# Patient Record
Sex: Female | Born: 1975 | Race: White | Hispanic: No | Marital: Married | Smoking: Never smoker
Health system: Southern US, Community
[De-identification: ages and names within clinical notes are randomized; demographics above are authoritative.]

## PROBLEM LIST (undated history)

## (undated) DIAGNOSIS — U071 COVID-19: Secondary | ICD-10-CM

## (undated) DIAGNOSIS — E282 Polycystic ovarian syndrome: Secondary | ICD-10-CM

## (undated) DIAGNOSIS — I10 Essential (primary) hypertension: Secondary | ICD-10-CM

## (undated) DIAGNOSIS — K219 Gastro-esophageal reflux disease without esophagitis: Secondary | ICD-10-CM

## (undated) DIAGNOSIS — E119 Type 2 diabetes mellitus without complications: Secondary | ICD-10-CM

## (undated) DIAGNOSIS — F419 Anxiety disorder, unspecified: Secondary | ICD-10-CM

## (undated) HISTORY — DX: Essential (primary) hypertension: I10

## (undated) HISTORY — DX: Gastro-esophageal reflux disease without esophagitis: K21.9

## (undated) HISTORY — DX: Type 2 diabetes mellitus without complications: E11.9

## (undated) HISTORY — DX: Polycystic ovarian syndrome: E28.2

## (undated) HISTORY — DX: Anxiety disorder, unspecified: F41.9

## (undated) HISTORY — PX: CHOLECYSTECTOMY: SHX55

## (undated) HISTORY — DX: COVID-19: U07.1

---

## 1998-09-19 ENCOUNTER — Other Ambulatory Visit: Admission: RE | Admit: 1998-09-19 | Discharge: 1998-09-19 | Payer: Self-pay | Admitting: Gynecology

## 2004-05-21 ENCOUNTER — Encounter (INDEPENDENT_AMBULATORY_CARE_PROVIDER_SITE_OTHER): Payer: Self-pay | Admitting: Internal Medicine

## 2004-05-21 LAB — CONVERTED CEMR LAB: Pap Smear: NORMAL

## 2006-03-20 ENCOUNTER — Ambulatory Visit: Payer: Self-pay | Admitting: Internal Medicine

## 2006-03-22 ENCOUNTER — Ambulatory Visit: Payer: Self-pay | Admitting: Internal Medicine

## 2006-06-25 ENCOUNTER — Encounter: Payer: Self-pay | Admitting: Internal Medicine

## 2006-06-25 DIAGNOSIS — R350 Frequency of micturition: Secondary | ICD-10-CM

## 2006-06-25 DIAGNOSIS — M545 Low back pain: Secondary | ICD-10-CM

## 2007-04-30 ENCOUNTER — Ambulatory Visit: Payer: Self-pay | Admitting: Internal Medicine

## 2007-04-30 ENCOUNTER — Encounter (INDEPENDENT_AMBULATORY_CARE_PROVIDER_SITE_OTHER): Payer: Self-pay | Admitting: Internal Medicine

## 2007-04-30 ENCOUNTER — Other Ambulatory Visit: Admission: RE | Admit: 2007-04-30 | Discharge: 2007-04-30 | Payer: Self-pay | Admitting: Internal Medicine

## 2007-04-30 ENCOUNTER — Telehealth (INDEPENDENT_AMBULATORY_CARE_PROVIDER_SITE_OTHER): Payer: Self-pay | Admitting: *Deleted

## 2007-04-30 DIAGNOSIS — N926 Irregular menstruation, unspecified: Secondary | ICD-10-CM | POA: Insufficient documentation

## 2007-04-30 DIAGNOSIS — L680 Hirsutism: Secondary | ICD-10-CM | POA: Insufficient documentation

## 2007-04-30 DIAGNOSIS — R5383 Other fatigue: Secondary | ICD-10-CM

## 2007-04-30 DIAGNOSIS — N939 Abnormal uterine and vaginal bleeding, unspecified: Secondary | ICD-10-CM

## 2007-04-30 DIAGNOSIS — R519 Headache, unspecified: Secondary | ICD-10-CM | POA: Insufficient documentation

## 2007-04-30 DIAGNOSIS — N393 Stress incontinence (female) (male): Secondary | ICD-10-CM | POA: Insufficient documentation

## 2007-04-30 DIAGNOSIS — R51 Headache: Secondary | ICD-10-CM

## 2007-04-30 DIAGNOSIS — R5381 Other malaise: Secondary | ICD-10-CM | POA: Insufficient documentation

## 2007-04-30 DIAGNOSIS — R7309 Other abnormal glucose: Secondary | ICD-10-CM

## 2007-04-30 LAB — CONVERTED CEMR LAB
Beta hcg, urine, semiquantitative: NEGATIVE
Bilirubin Urine: NEGATIVE
Glucose, Urine, Semiquant: NEGATIVE
Hemoglobin: 14.9 g/dL
Specific Gravity, Urine: 1.025
pH: 5.5

## 2007-05-05 ENCOUNTER — Encounter (INDEPENDENT_AMBULATORY_CARE_PROVIDER_SITE_OTHER): Payer: Self-pay | Admitting: Internal Medicine

## 2007-05-06 LAB — CONVERTED CEMR LAB
ALT: 24 units/L (ref 0–35)
AST: 17 units/L (ref 0–37)
Basophils Absolute: 0 10*3/uL (ref 0.0–0.1)
Basophils Relative: 0 % (ref 0–1)
Calcium: 9 mg/dL (ref 8.4–10.5)
Chloride: 103 meq/L (ref 96–112)
Creatinine, Ser: 0.64 mg/dL (ref 0.40–1.20)
DHEA-SO4: 91 ug/dL (ref 35–430)
Hemoglobin: 14.2 g/dL (ref 12.0–15.0)
MCHC: 32.9 g/dL (ref 30.0–36.0)
Monocytes Absolute: 0.6 10*3/uL (ref 0.1–1.0)
Neutro Abs: 4.7 10*3/uL (ref 1.7–7.7)
Platelets: 391 10*3/uL (ref 150–400)
Prolactin: 4.1 ng/mL
RDW: 13.2 % (ref 11.5–15.5)
Testosterone: 62.33 ng/dL (ref 10–70)
Total Bilirubin: 0.4 mg/dL (ref 0.3–1.2)
hCG, Beta Chain, Quant, S: <2 milliintl units/mL

## 2007-05-07 ENCOUNTER — Telehealth (INDEPENDENT_AMBULATORY_CARE_PROVIDER_SITE_OTHER): Payer: Self-pay | Admitting: *Deleted

## 2007-05-12 ENCOUNTER — Ambulatory Visit: Payer: Self-pay | Admitting: Internal Medicine

## 2007-05-12 DIAGNOSIS — E282 Polycystic ovarian syndrome: Secondary | ICD-10-CM

## 2007-06-03 ENCOUNTER — Telehealth (INDEPENDENT_AMBULATORY_CARE_PROVIDER_SITE_OTHER): Payer: Self-pay | Admitting: Internal Medicine

## 2007-12-10 ENCOUNTER — Ambulatory Visit: Payer: Self-pay | Admitting: Internal Medicine

## 2007-12-10 DIAGNOSIS — R252 Cramp and spasm: Secondary | ICD-10-CM

## 2007-12-10 DIAGNOSIS — M7989 Other specified soft tissue disorders: Secondary | ICD-10-CM

## 2007-12-10 DIAGNOSIS — D239 Other benign neoplasm of skin, unspecified: Secondary | ICD-10-CM | POA: Insufficient documentation

## 2007-12-10 LAB — CONVERTED CEMR LAB: TSH: 0.649 microintl units/mL (ref 0.350–4.50)

## 2007-12-11 LAB — CONVERTED CEMR LAB
Calcium: 9.3 mg/dL (ref 8.4–10.5)
Creatinine, Ser: 0.72 mg/dL (ref 0.40–1.20)
Total CK: 86 units/L (ref 7–177)

## 2009-11-11 ENCOUNTER — Ambulatory Visit (HOSPITAL_COMMUNITY): Admission: RE | Admit: 2009-11-11 | Discharge: 2009-11-11 | Payer: Self-pay | Admitting: General Surgery

## 2010-08-06 LAB — CBC
Platelets: 336 10*3/uL (ref 150–400)
RDW: 13.1 % (ref 11.5–15.5)

## 2010-08-06 LAB — SURGICAL PCR SCREEN: MRSA, PCR: NEGATIVE

## 2010-08-06 LAB — BASIC METABOLIC PANEL
BUN: 13 mg/dL (ref 6–23)
Calcium: 9.1 mg/dL (ref 8.4–10.5)
GFR calc non Af Amer: >60 mL/min (ref >60)
Glucose, Bld: 172 mg/dL — ABNORMAL HIGH (ref 70–99)
Sodium: 134 mEq/L — ABNORMAL LOW (ref 135–145)

## 2012-07-02 ENCOUNTER — Ambulatory Visit: Payer: Self-pay | Admitting: Family Medicine

## 2012-08-13 ENCOUNTER — Encounter: Payer: Self-pay | Admitting: Family Medicine

## 2012-08-13 ENCOUNTER — Ambulatory Visit (INDEPENDENT_AMBULATORY_CARE_PROVIDER_SITE_OTHER): Payer: PRIVATE HEALTH INSURANCE | Admitting: Family Medicine

## 2012-08-13 VITALS — BP 160/104 | HR 97 | Resp 16 | Ht 63.0 in | Wt 216.0 lb

## 2012-08-13 DIAGNOSIS — M25561 Pain in right knee: Secondary | ICD-10-CM

## 2012-08-13 DIAGNOSIS — E785 Hyperlipidemia, unspecified: Secondary | ICD-10-CM

## 2012-08-13 DIAGNOSIS — Z1211 Encounter for screening for malignant neoplasm of colon: Secondary | ICD-10-CM

## 2012-08-13 DIAGNOSIS — R7301 Impaired fasting glucose: Secondary | ICD-10-CM

## 2012-08-13 DIAGNOSIS — E669 Obesity, unspecified: Secondary | ICD-10-CM

## 2012-08-13 DIAGNOSIS — M25569 Pain in unspecified knee: Secondary | ICD-10-CM

## 2012-08-13 DIAGNOSIS — I10 Essential (primary) hypertension: Secondary | ICD-10-CM

## 2012-08-13 DIAGNOSIS — E1169 Type 2 diabetes mellitus with other specified complication: Secondary | ICD-10-CM

## 2012-08-13 DIAGNOSIS — E119 Type 2 diabetes mellitus without complications: Secondary | ICD-10-CM

## 2012-08-13 DIAGNOSIS — E8881 Metabolic syndrome: Secondary | ICD-10-CM

## 2012-08-13 DIAGNOSIS — R5381 Other malaise: Secondary | ICD-10-CM

## 2012-08-13 MED ORDER — TRIAMTERENE-HCTZ 37.5-25 MG PO TABS: 1.0000 | ORAL_TABLET | Freq: Every day | ORAL | Status: DC

## 2012-08-13 NOTE — Patient Instructions (Addendum)
F/u in 6 weeks.  You DO have high blood pressure, medication sent start taking once daily every morning  Eat a lot of fresh or frozen vegetable and fruit, cut back on salt, canned foods, and sodas, commit to daily exercise/walking for 20 to 30 minutes , and weight loss please  Tylenol one twice daily for 10 days for thigh pain, call back if no better.  You are referred to Dr Hilda Lias for right knee pain and popping   Fasting labs tomorrow, please ensure you hear the results on Friday. CBC, lipid, cmp, TSH, hBA1C and vit D

## 2012-08-13 NOTE — Progress Notes (Signed)
  Subjective:    Patient ID: Deborah Sutton, female    DOB: 1975-12-11, 37 y.o.   MRN: [id number]  HPI New pt establishment Feb 1 knee popped , right, since  Then has experienced knee pain and instability  2 week h/o bilateral throbbing and fatigue in thighs after standing for 9 hrs,  H/o back pain Fatigue x x 6 weeks. Concerned about weight , had gestational diabetes, has noted increased thirst and excess urination in the past several months. Has been trying with little success to lose weight by cutting back on portions and commitment to exercise in the last approx 6 weeks  Review of Systems See HPI Denies recent fever or chills.c/o fatigue Denies sinus pressure, nasal congestion, ear pain or sore throat. Denies chest congestion, productive cough or wheezing. Denies chest pains, palpitations and leg swelling Denies abdominal pain, nausea, vomiting,diarrhea or constipation.   Denies dysuria, , hesitancy or incontinence. . Denies headaches, seizures, numbness, or tingling. Denies depression, anxiety or insomnia. Denies skin break down or rash.        Objective:   Physical Exam Patient alert and oriented and in no cardiopulmonary distress.Slightly anxious , pleasant female  HEENT: No facial asymmetry, EOMI, no sinus tenderness,  oropharynx pink and moist.  Neck supple no adenopathy.  Chest: Clear to auscultation bilaterally.  CVS: S1, S2 no murmurs, no S3.  ABD: Soft non tender. Bowel sounds normal.  Ext: No edema  MS: Adequate ROM spine, shoulders, hips and reduced in right   knee.  Skin: Intact, no ulcerations or rash noted.  Psych: Good eye contact, normal affect. Memory intact not anxious or depressed appearing.  CNS: CN 2-12 intact, power, tone and sensation normal throughout.        Assessment & Plan:

## 2012-08-14 LAB — COMPREHENSIVE METABOLIC PANEL
ALT: 26 U/L (ref 0–35)
AST: 23 U/L (ref 0–37)
CO2: 25 mEq/L (ref 19–32)
Sodium: 136 mEq/L (ref 135–145)
Total Bilirubin: 0.5 mg/dL (ref 0.3–1.2)
Total Protein: 7.7 g/dL (ref 6.0–8.3)

## 2012-08-14 LAB — TSH: TSH: 0.734 u[IU]/mL (ref 0.350–4.500)

## 2012-08-14 LAB — LIPID PANEL
HDL: 38 mg/dL — ABNORMAL LOW (ref >39)
LDL Cholesterol: 90 mg/dL (ref 0–99)

## 2012-08-14 LAB — CBC WITH DIFFERENTIAL/PLATELET
Basophils Absolute: 0 10*3/uL (ref 0.0–0.1)
Basophils Relative: 0 % (ref 0–1)
Eosinophils Relative: 3 % (ref 0–5)
HCT: 43.5 % (ref 36.0–46.0)
Lymphocytes Relative: 42 % (ref 12–46)
MCHC: 34.5 g/dL (ref 30.0–36.0)
MCV: 85 fL (ref 78.0–100.0)
Monocytes Absolute: 0.5 10*3/uL (ref 0.1–1.0)
RDW: 13.7 % (ref 11.5–15.5)

## 2012-08-15 DIAGNOSIS — E119 Type 2 diabetes mellitus without complications: Secondary | ICD-10-CM | POA: Insufficient documentation

## 2012-08-15 DIAGNOSIS — E1169 Type 2 diabetes mellitus with other specified complication: Secondary | ICD-10-CM | POA: Insufficient documentation

## 2012-08-15 MED ORDER — METFORMIN HCL 500 MG PO TABS: 500.0000 mg | ORAL_TABLET | Freq: Two times a day (BID) | ORAL | Status: DC

## 2012-08-15 MED ORDER — PRAVASTATIN SODIUM 20 MG PO TABS: 20.0000 mg | ORAL_TABLET | Freq: Every day | ORAL | Status: DC

## 2012-08-18 ENCOUNTER — Other Ambulatory Visit: Payer: Self-pay | Admitting: Family Medicine

## 2012-08-18 ENCOUNTER — Telehealth: Payer: Self-pay | Admitting: Family Medicine

## 2012-08-18 MED ORDER — SITAGLIPTIN PHOSPHATE 50 MG PO TABS: 50.0000 mg | ORAL_TABLET | Freq: Every day | ORAL | Status: DC

## 2012-08-18 NOTE — Telephone Encounter (Signed)
C/o diarheah since taking the metformin, having nausea and diarrhea, also will collect [place] script and she is to hold on statin states having neck pain with it She will need to collect coupon for this, januvia, on Friday if too expensive, she will let you know

## 2012-08-18 NOTE — Telephone Encounter (Signed)
Noted and rx and coupon awaiting pick up by patient.

## 2012-08-22 ENCOUNTER — Telehealth (HOSPITAL_COMMUNITY): Payer: Self-pay | Admitting: Dietician

## 2012-08-22 NOTE — Telephone Encounter (Signed)
Received referral via fax from Dr. Simpson for dx: diabetes.  

## 2012-08-22 NOTE — Telephone Encounter (Signed)
Called and left voicemail at [phone].

## 2012-08-25 NOTE — Telephone Encounter (Signed)
fyi

## 2012-08-25 NOTE — Telephone Encounter (Signed)
Pt left voicemail on 08/22/12 at 1503. Called and left message on cell phone at [phone]. Called work number at [employer]. Appointment scheduled for 08/27/12 at 0900.

## 2012-08-27 ENCOUNTER — Encounter (HOSPITAL_COMMUNITY): Payer: Self-pay | Admitting: Dietician

## 2012-08-27 NOTE — Progress Notes (Signed)
Outpatient Initial Nutrition Assessment  Date:08/27/2012   Appt Start Time: [phone]  Referring Physician: Dr. Lodema Hong Reason for Visit: new onset diabetes  Nutrition Assessment:  Height: 5\' 3"  (160 cm)   Weight: 203 lb (92.08 kg)   IBW: 115# %IBW: 171# UBW: 203# %UBW: 100% Body mass index is 35.97 kg/(m^2). Classified as obesity, class II.   Goal Weight: 183# (10% loss of current weight) Weight hx: UBW: 204#. Highest weight was 227# about 5 years ago. Her lowest weight was 187# 11 years ago.   Estimated nutritional needs: 1500-1600 kcals daily, 74-92 grams protein daily, 1.5-1.6 L fluid daily  PMH:  Past Medical History  Diagnosis Date  . Polycystic ovarian syndrome   . Hypertension     Medications:  Current Outpatient Rx  Name  Route  Sig  Dispense  Refill  . pravastatin (PRAVACHOL) 20 MG tablet   Oral   Take 1 tablet (20 mg total) by mouth daily.   30 tablet   4   . sitaGLIPtin (JANUVIA) 50 MG tablet   Oral   Take 1 tablet (50 mg total) by mouth daily.   30 tablet   5     Discontinue metformin   . triamterene-hydrochlorothiazide (MAXZIDE-25) 37.5-25 MG per tablet   Oral   Take 1 each (1 tablet total) by mouth daily.   30 tablet   3     Labs: CMP     Component Value Date/Time   NA 136 08/14/2012 1120   K 4.4 08/14/2012 1120   CL 101 08/14/2012 1120   CO2 25 08/14/2012 1120   GLUCOSE 131* 08/14/2012 1120   BUN 11 08/14/2012 1120   CREATININE 0.89 08/14/2012 1120   CREATININE 0.71 11/08/2009 0825   CALCIUM 9.6 08/14/2012 1120   PROT 7.7 08/14/2012 1120   ALBUMIN 4.4 08/14/2012 1120   AST 23 08/14/2012 1120   ALT 26 08/14/2012 1120   ALKPHOS 56 08/14/2012 1120   BILITOT 0.5 08/14/2012 1120   GFRNONAA >60 11/08/2009 0825   GFRAA  Value: >60        The eGFR has been calculated using the MDRD equation. This calculation has not been validated in all clinical situations. eGFR's persistently <60 mL/min signify possible Chronic Kidney Disease. 11/08/2009 0825    Lipid Panel      Component Value Date/Time   CHOL 192 08/14/2012 1120   TRIG 319* 08/14/2012 1120   HDL 38* 08/14/2012 1120   CHOLHDL 5.1 08/14/2012 1120   VLDL 64* 08/14/2012 1120   LDLCALC 90 08/14/2012 1120     Lab Results  Component Value Date   HGBA1C 7.1* 08/14/2012   Lab Results  Component Value Date   LDLCALC 90 08/14/2012   CREATININE 0.89 08/14/2012     Lifestyle/ social habits: Deborah Sutton is an extremely pleasant lady who lives with her husband, Augusto Gamble, who is present with her today, and their 67 year old daughter, Thea Silversmith. 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 She reports her stress level as an 8-9/10, citing her her job (more specifically corporate expectations and personality conflict with her Agricultural consultant) as her main source of stress. She notes that she has no stress at home. Her husband is very supportive and her daughter is very well-behaved. She would like to make lifestyle changes as a family. She reports she has started walking with her family and 2 dogs about 2 weeks ago, walking about 1-2 miles 3-4 times per week. She reports that she  used exercise videos in the past, but has not followed a consistent exercise routine in several years.   Nutrition hx/habits: [name] reports that she developed gestational diabetes 12 years ago, when she was pregnant with her daughter. She reports that she took a GDM class and she was able to have a very healthy pregnancy, being able to control GDM with diet alone and achieved her lowest adult weight after her daughter was born. She is trying to incorporate things she remembered from this class, but has difficulty recalling all the principles because it has been so long since she took the class. She reports for the past 2 months she has decreased red meat and significant reduced soda and sweets in her diet. She reports she would have a late night snack in front of the television before bedtime which usually consisted of ice cream and cookies, but  she no longer engages in this practice. Her diet has been very high in fat, sugar, and junk food prior to diabetes diagnosis. She reports that she loves sweets.  She is very determined to control her diabetes with diet alone. She reports she was unable to tolerate Metformin and was subsequently prescribed Januvia, which she has not filled yet. She reports Dr. Lodema Hong is giving her until mid May to lose weight and modify her diet and then reevaluate labs. She just started checking fasting CBGS and readings have ranged from 106-167. She is concerned about her weight. She desires to lose weight to improve glycemic control, but confided it is extremely difficult for her top lose weight due to PCOS. She reports that she knows she has last weight over the past weeks because her clothing is looser.   Diet recall: Breakfast: 2/3 cup bran cereal with 2% milk; Lunch: tuna fish sandwich on toast, clementine; Dinner: baked chicken, green beans, red potatoes. Beverages consists mostly of water however, pt does admit to having a 12 oz glass of sweet tea at dinner time.   Nutrition Diagnosis: Nutrition-related knowledge deficit r/t new diagnosed diabetes AEb Hgb A1c: 7.1.  Nutrition Intervention: Nutrition rx: 1300 kcal NAS, diabetic diet; 3 meals per day; 45-60 grams of carbohydrate per meal; low calorie beverages only; 2.5 hours physical activity per week  Education/Counseling Provided: Educated pt on principles of diabetic diet. Discussed carbohydrate metabolism in relation to diabetes. Educated pt on basic self-management principles including: signs and symptoms of hyperglycemia and hypoglycemia, goals for fasting and postprandial blood sugars, goals for Hgb A1c, importance of checking feet, importance of keeping PCP appointments, and foot care. Educated pt on plate method, portion sizes, and sources of carbohydrate. Discussed importance of regular meal pattern. Discussed importance of adding sources of whole grains  to diet to improve glycemic control. Also encouraged to choose low fat dairy, lean meats, and whole fruits and vegetables more often. Discussed options of artificial sweeteners and encouraged pt to use which brand she liked best. Discussed nutritional content of foods commonly eaten and discussed healthier alternatives. Discussed importance of compliance to prevent further complications of disease. Educated pt on importance of physical activity (goal of at least 30 minutes 5 times per week) along with a healthy diet to achieve weight loss and glycemic goals. Encouraged slow, moderate weight loss of 1-2# per week, or 7-10% of current body weight. Provided "Go With The Whole Grain", "My Blood Sugar Diary", "Carbohydrate Counting and Meal Planning", "Guide to Better Office Visits", and "Diabetes and You" handouts. Used TeachBack to assess understanding.   Understanding, Motivation,  Ability to Follow Recommendations: Expect good compliance.   Monitoring and Evaluation: Goals: 1) 1-2# weight loss per week; 2) Hgb A1c < 7.0; 3) 2.5 hours physical activity per week   Recommendations: 1) For weight loss: 1300 kcals daily; 2) Try to eat meals around the same time each day; 3) Incorporate a small snack (15-30 grams carbs) if next meal is longer than 4-5 hours after next meal   F/U: 4-6 weeks. F/U 10/01/12 at 9:30 AM.   Melody Haver, RD, LDN 08/27/2012  Appt EndTime: 1040

## 2012-08-27 NOTE — Progress Notes (Signed)
Noted  

## 2012-08-31 DIAGNOSIS — E669 Obesity, unspecified: Secondary | ICD-10-CM | POA: Insufficient documentation

## 2012-08-31 NOTE — Assessment & Plan Note (Signed)
Ongoing pain following acute injury, reports instability also, ortho referral

## 2012-08-31 NOTE — Assessment & Plan Note (Signed)
Started on metformin, pt intolerant, severe diarrhea, did not want to try [place], wants to opt for lifestyle managemnt only, states she will commit to class, and exercise and will test her blood sugar

## 2012-08-31 NOTE — Assessment & Plan Note (Signed)
Uncontrolled, first formal diagnosis, pt to start medication since value is so high. DASH diet and commitment to daily physical activity for a minimum of 30 minutes discussed and encouraged, as a part of hypertension management. The importance of attaining a healthy weight is also discussed.

## 2012-08-31 NOTE — Assessment & Plan Note (Signed)
.   Patient educated about  the importance of commitment to a  minimum of 150 minutes of exercise per week. The importance of healthy food choices with portion control discussed. Encouraged to start a food diary, count calories and to consider  joining a support group. Sample diet sheets offered. Goals set by the patient for the next several months.    

## 2012-08-31 NOTE — Assessment & Plan Note (Signed)
Based on dx should be on a statin. After adverse reaction to med prescribed uncertain she will comply with med. Very willing to work on lifestyle only, seems very motivaated

## 2012-09-01 ENCOUNTER — Telehealth: Payer: Self-pay | Admitting: Family Medicine

## 2012-09-01 MED ORDER — ONETOUCH DELICA LANCETS 33G MISC: 1.0000 | Freq: Every day | Status: DC

## 2012-09-01 MED ORDER — GLUCOSE BLOOD VI STRP: ORAL_STRIP | Status: DC

## 2012-09-01 NOTE — Telephone Encounter (Signed)
Strips and lancets sent

## 2012-10-01 ENCOUNTER — Encounter: Payer: Self-pay | Admitting: Family Medicine

## 2012-10-01 ENCOUNTER — Encounter (HOSPITAL_COMMUNITY): Payer: Self-pay | Admitting: Dietician

## 2012-10-01 ENCOUNTER — Ambulatory Visit (INDEPENDENT_AMBULATORY_CARE_PROVIDER_SITE_OTHER): Payer: PRIVATE HEALTH INSURANCE | Admitting: Family Medicine

## 2012-10-01 VITALS — BP 120/88 | HR 63 | Resp 16 | Ht 63.0 in | Wt 197.0 lb

## 2012-10-01 DIAGNOSIS — R5383 Other fatigue: Secondary | ICD-10-CM

## 2012-10-01 DIAGNOSIS — E1169 Type 2 diabetes mellitus with other specified complication: Secondary | ICD-10-CM

## 2012-10-01 DIAGNOSIS — I10 Essential (primary) hypertension: Secondary | ICD-10-CM

## 2012-10-01 DIAGNOSIS — L851 Acquired keratosis [keratoderma] palmaris et plantaris: Secondary | ICD-10-CM

## 2012-10-01 DIAGNOSIS — E119 Type 2 diabetes mellitus without complications: Secondary | ICD-10-CM

## 2012-10-01 DIAGNOSIS — E669 Obesity, unspecified: Secondary | ICD-10-CM

## 2012-10-01 DIAGNOSIS — E785 Hyperlipidemia, unspecified: Secondary | ICD-10-CM

## 2012-10-01 DIAGNOSIS — R5381 Other malaise: Secondary | ICD-10-CM

## 2012-10-01 DIAGNOSIS — R234 Changes in skin texture: Secondary | ICD-10-CM | POA: Insufficient documentation

## 2012-10-01 DIAGNOSIS — Z13220 Encounter for screening for lipoid disorders: Secondary | ICD-10-CM

## 2012-10-01 NOTE — Progress Notes (Signed)
  Subjective:    Patient ID: Deborah Sutton, female    DOB: [birth year], 37 y.o.   MRN: [id number]  HPI The PT is here for follow up and re-evaluation of chronic medical conditions, medication management and review of any available recent lab and radiology data.  Preventive health is updated, specifically  Cancer screening and Immunization.  Refuses immunizarion recommended, and will sched pap Questions or concerns regarding consultations or procedures which the PT has had in the interim are  addressed. The PT denies any adverse reactions to current medications since the last visit.  Marked improvement in weight and blood sugar with lifestyle change only , wants to continue this for diabetes and lipid management    Review of Systems See HPI Denies recent fever or chills. Denies sinus pressure, nasal congestion, ear pain or sore throat. Denies chest congestion, productive cough or wheezing. Denies chest pains, palpitations and leg swelling Denies abdominal pain, nausea, vomiting,diarrhea or constipation.   Denies dysuria, frequency, hesitancy or incontinence. Denies joint pain, swelling and limitation in mobility. Denies headaches, seizures, numbness, or tingling. Denies depression, anxiety or insomnia. C/o thickened skin on sole wants help with this        Objective:   Physical Exam   Patient alert and oriented and in no cardiopulmonary distress.  HEENT: No facial asymmetry, EOMI, no sinus tenderness,  oropharynx pink and moist.  Neck supple no adenopathy.  Chest: Clear to auscultation bilaterally.  CVS: S1, S2 no murmurs, no S3.  ABD: Soft non tender. Bowel sounds normal.  Ext: No edema  MS: Adequate ROM spine, shoulders, hips and knees.  Skin: Intact, marked thickening of sole  Psych: Good eye contact, normal affect. Memory intact not anxious or depressed appearing.  CNS: CN 2-12 intact, power, tone and sensation normal throughout.      Assessment & Plan:

## 2012-10-01 NOTE — Patient Instructions (Addendum)
F/u early October , please call if you need me before  Non fasting HBA1C and chem 7 and EGFR  First week in July  CONGRATS on 20 pound weight loss, continue the GREAT habits you have started.   Schedule your eye exam and gyne exam please  Microalb from office today  You are referred to dermatology  Fasting lipid, chem 7 and hBA1C in October before follow up  Weight loss goal of 20 pounds in the next 4.5 months:-)

## 2012-10-01 NOTE — Progress Notes (Signed)
Follow-Up Outpatient Nutrition Note Date: 10/01/2012  Appt Start Time: 0955  Nutrition Assessment:  Current weight: Weight: 193 lb (87.544 kg)  BMI: Body mass index is 34.2 kg/(m^2).  Weight changes: -10# (5.1%) x 1 month  Deborah Sutton has made excellent progress. She has lost 5.1% pounds x 1 month, which is significant. She reports that she has been working hard on her diet and exercise, although complains of being bored of her diet, due to eating the same things constantly. She reports she also feels guilty when she indulges in sweets, even if it is just a small piece of cake at a birthday celebration.  She reports that she is no longer taking medication for diabetes, as she has been given permission to manage blood sugars through lifestyle changes and will reevaluate in July.  She is very determined to be prescribed as little medicine as possible and desires to follow a healthy lifestyle.  Reviewed glucometer. Readings from the past month have ranged between 91-240, usually between 90-120. 7 day average is 122, 14 day average is 128, 30 day averages is 122, and 90 day averages is 122. Pt reports indulging to sweets on vacation a few weeks ago and seeing higher than normal blood sugars, however, blood sugars normalized after returning from vacation. She reports she has 1 instance a few weeks ago where her blood sugar was in the 160's, but couldn't determine why.   Labs: CMP     Component Value Date/Time   NA 136 08/14/2012 1120   K 4.4 08/14/2012 1120   CL 101 08/14/2012 1120   CO2 25 08/14/2012 1120   GLUCOSE 131* 08/14/2012 1120   BUN 11 08/14/2012 1120   CREATININE 0.89 08/14/2012 1120   CREATININE 0.71 11/08/2009 0825   CALCIUM 9.6 08/14/2012 1120   PROT 7.7 08/14/2012 1120   ALBUMIN 4.4 08/14/2012 1120   AST 23 08/14/2012 1120   ALT 26 08/14/2012 1120   ALKPHOS 56 08/14/2012 1120   BILITOT 0.5 08/14/2012 1120   GFRNONAA >60 11/08/2009 0825   GFRAA  Value: >60        The eGFR has been calculated  using the MDRD equation. This calculation has not been validated in all clinical situations. eGFR's persistently <60 mL/min signify possible Chronic Kidney Disease. 11/08/2009 0825    Lipid Panel     Component Value Date/Time   CHOL 192 08/14/2012 1120   TRIG 319* 08/14/2012 1120   HDL 38* 08/14/2012 1120   CHOLHDL 5.1 08/14/2012 1120   VLDL 64* 08/14/2012 1120   LDLCALC 90 08/14/2012 1120     Lab Results  Component Value Date   HGBA1C 7.1* 08/14/2012   Lab Results  Component Value Date   LDLCALC 90 08/14/2012   CREATININE 0.89 08/14/2012     Diet recall: Breakfast: egg OR cereal with low fat milk OR oatmeal OR toast with peanut nutter, 1/2 apple or banana; Lunch: sandwich or leftovers (chicken and vegetables); Dinner: chicken and vegetables. Pt reports she will snack between breakfast and lunch and/or lunch and dinner: snack will consist or yogurt or 100 calorie pack cookies. Pt also reports eating 1 cookie around 7 PM on some nights. Beverages are mostly water. SHe has cut out juice and soda. She will occasionally drink milk.   Nutrition Diagnosis:Nutrition-related knowledge deficit- continues, but progressing  Nutrition Intervention: Nutrition rx: 1300 kcal NAS, diabetic diet; 3 meals per day; 45-60 grams of carbohydrate per meal; low calorie beverages only; 2.5 hours physical activity per  week  Education/ counseling provided: Reviewed principles of diabetic diet and carb counting. Discussed ideas to make healthier choices at restaurants, such as choosing smaller portions or bagging or sharing half the entree, as well as deciding what to order prior to visiting the restaurant. Discussed meal and snack ideas for variety. Discussed ways to incorporpate favorite foods into diet, diet decreasing portion sizes and frequency of rich foods. Encouraged continued exercise. Encouraged continued glucose monitoring and continue to review patterns in readings. Praised pt for efforts made. Teachback method  used.   Understanding/Motivation/ Ability to follow recommendations: Expect good compliance.   Monitoring and Evaluation: Previous Goals: 1) 1-2# weight loss per week- goal met; 2) Hgb A1c < 7.0- unable to assess; 3) 2.5 hours physical activity per week-goal met  Goals for next visit: 1) 1-2# weight loss per week; 2) Hgb A1c < 7.0; 3) 2.5 hours physical activity per week   Recommendations: 1) Add protein source to snacks; 2) Use measuring cups to determine accurate size of portions; 3) Vary exercise routine  F/U:  PRN. Provided RD contact information.   Deborah Sutton, RD, LDN Date:10/01/2012 Appt EndTime: 1610

## 2012-10-02 LAB — MICROALBUMIN / CREATININE URINE RATIO
Creatinine, Urine: 100.8 mg/dL
Microalb Creat Ratio: 5 mg/g (ref 0.0–30.0)
Microalb, Ur: 0.5 mg/dL (ref 0.00–1.89)

## 2012-10-26 NOTE — Assessment & Plan Note (Signed)
Improved. Pt applauded on succesful weight loss through lifestyle change, and encouraged to continue same. Weight loss goal set for the next several months.  

## 2012-10-26 NOTE — Assessment & Plan Note (Addendum)
Dietary management with exercise only with marked improvement and excellent fasting sugars by history. Pt is highly motivate and doing well , no change

## 2012-10-26 NOTE — Assessment & Plan Note (Signed)
Improved though sub optimal , no med chane DASH diet and commitment to daily physical activity for a minimum of 30 minutes discussed and encouraged, as a part of hypertension management. The importance of attaining a healthy weight is also discussed.

## 2012-10-26 NOTE — Assessment & Plan Note (Signed)
Painful, referred to derm for management

## 2012-11-27 ENCOUNTER — Other Ambulatory Visit: Payer: Self-pay

## 2012-12-22 ENCOUNTER — Other Ambulatory Visit: Payer: Self-pay

## 2012-12-22 DIAGNOSIS — I10 Essential (primary) hypertension: Secondary | ICD-10-CM

## 2012-12-22 MED ORDER — TRIAMTERENE-HCTZ 37.5-25 MG PO TABS
1.0000 | ORAL_TABLET | Freq: Every day | ORAL | Status: DC
Start: 2012-12-22 — End: 2013-03-04

## 2013-02-19 ENCOUNTER — Other Ambulatory Visit: Payer: Self-pay | Admitting: Family Medicine

## 2013-02-19 LAB — LIPID PANEL
Cholesterol: 154 mg/dL (ref 0–200)
HDL: 34 mg/dL — ABNORMAL LOW (ref >39)
Total CHOL/HDL Ratio: 4.5 Ratio
Triglycerides: 103 mg/dL (ref <150)
VLDL: 21 mg/dL (ref 0–40)

## 2013-02-19 LAB — BASIC METABOLIC PANEL
Glucose, Bld: 96 mg/dL (ref 70–99)
Potassium: 4.7 mEq/L (ref 3.5–5.3)
Sodium: 137 mEq/L (ref 135–145)

## 2013-03-04 ENCOUNTER — Encounter: Payer: Self-pay | Admitting: Family Medicine

## 2013-03-04 ENCOUNTER — Ambulatory Visit (INDEPENDENT_AMBULATORY_CARE_PROVIDER_SITE_OTHER): Payer: PRIVATE HEALTH INSURANCE | Admitting: Family Medicine

## 2013-03-04 VITALS — BP 140/90 | HR 72 | Resp 18 | Ht 63.0 in | Wt 183.1 lb

## 2013-03-04 DIAGNOSIS — E785 Hyperlipidemia, unspecified: Secondary | ICD-10-CM

## 2013-03-04 DIAGNOSIS — E669 Obesity, unspecified: Secondary | ICD-10-CM

## 2013-03-04 DIAGNOSIS — E1169 Type 2 diabetes mellitus with other specified complication: Secondary | ICD-10-CM

## 2013-03-04 DIAGNOSIS — R5381 Other malaise: Secondary | ICD-10-CM

## 2013-03-04 DIAGNOSIS — I10 Essential (primary) hypertension: Secondary | ICD-10-CM

## 2013-03-04 DIAGNOSIS — Z23 Encounter for immunization: Secondary | ICD-10-CM

## 2013-03-04 DIAGNOSIS — E119 Type 2 diabetes mellitus without complications: Secondary | ICD-10-CM

## 2013-03-04 NOTE — Assessment & Plan Note (Signed)
Uncontrolled, pt has discontinued her medication, she is advised to resume same DASH diet and commitment to daily physical activity for a minimum of 30 minutes discussed and encouraged, as a part of hypertension management. The importance of attaining a healthy weight is also discussed.

## 2013-03-04 NOTE — Progress Notes (Signed)
  Subjective:    Patient ID: Deborah Sutton, female    DOB: 1975/12/02, 37 y.o.   MRN: [id number]  HPI The PT is here for follow up and re-evaluation of chronic medical conditions, medication management and review of any available recent lab and radiology data.  Preventive health is updated, specifically  Cancer screening and Immunization.   Questions or concerns regarding consultations or procedures which the PT has had in the interim are  Addressed.Did see podiatry, needs pap still There are no new concerns.  There are no specific complaints  Exercises 5 days per week , for 90 mins per session, eating 1350 cals per day      Review of Systems See HPI Denies recent fever or chills. Denies sinus pressure, nasal congestion, ear pain or sore throat. Denies chest congestion, productive cough or wheezing. Denies chest pains, palpitations and leg swelling Denies abdominal pain, nausea, vomiting,diarrhea or constipation.   Denies dysuria, frequency, hesitancy or incontinence. Denies joint pain, swelling and limitation in mobility. Denies headaches, seizures, numbness, or tingling. Denies depression, anxiety or insomnia. Denies skin break down or rash.        Objective:   Physical Exam  Patient alert and oriented and in no cardiopulmonary distress.  HEENT: No facial asymmetry, EOMI, no sinus tenderness,  oropharynx pink and moist.  Neck supple no adenopathy.  Chest: Clear to auscultation bilaterally.  CVS: S1, S2 no murmurs, no S3.  ABD: Soft non tender. Bowel sounds normal.  Ext: No edema  MS: Adequate ROM spine, shoulders, hips and knees.  Skin: Intact, no ulcerations or rash noted.  Psych: Good eye contact, normal affect. Memory intact not anxious or depressed appearing.  CNS: CN 2-12 intact, power, tone and sensation normal throughout.       Assessment & Plan:

## 2013-03-04 NOTE — Assessment & Plan Note (Addendum)
Marked improvement with change in diet Pt applauded on this Hyperlipidemia:Low fat diet discussed and encouraged. She is to commit to exercise 7 days per week instead of 5 days per week

## 2013-03-04 NOTE — Patient Instructions (Signed)
F/u in 6 month, early April, call if you need me before  CONGRATS, keep going forward  HBA1C, chem 7, lipid, tSH, CBC fasting for April visit  Weight loss goal of 2.5 to 3 pounds per month  Commit to physical activity or health 7 days per week, 45 minutes

## 2013-03-04 NOTE — Assessment & Plan Note (Signed)
Patient advised to reduce carb and sweets, commit to regular physical activity, take meds as prescribed, test blood as directed, and attempt to lose weight, to improve blood sugar control. improved

## 2013-03-04 NOTE — Assessment & Plan Note (Signed)
Improved. Pt applauded on succesful weight loss through lifestyle change, and encouraged to continue same. Weight loss goal set for the next several months.  

## 2014-04-13 ENCOUNTER — Ambulatory Visit: Payer: PRIVATE HEALTH INSURANCE | Admitting: Family Medicine

## 2014-04-20 ENCOUNTER — Encounter: Payer: Self-pay | Admitting: *Deleted

## 2014-04-20 ENCOUNTER — Ambulatory Visit: Payer: PRIVATE HEALTH INSURANCE | Admitting: Family Medicine

## 2015-01-03 ENCOUNTER — Encounter: Payer: Self-pay | Admitting: Family Medicine

## 2015-01-03 ENCOUNTER — Ambulatory Visit (INDEPENDENT_AMBULATORY_CARE_PROVIDER_SITE_OTHER): Payer: Self-pay | Admitting: Family Medicine

## 2015-01-03 VITALS — BP 136/74 | HR 76 | Resp 18 | Ht 63.0 in | Wt 214.1 lb

## 2015-01-03 DIAGNOSIS — F329 Major depressive disorder, single episode, unspecified: Secondary | ICD-10-CM

## 2015-01-03 DIAGNOSIS — E282 Polycystic ovarian syndrome: Secondary | ICD-10-CM

## 2015-01-03 DIAGNOSIS — E119 Type 2 diabetes mellitus without complications: Secondary | ICD-10-CM

## 2015-01-03 DIAGNOSIS — E669 Obesity, unspecified: Secondary | ICD-10-CM

## 2015-01-03 DIAGNOSIS — R194 Change in bowel habit: Secondary | ICD-10-CM

## 2015-01-03 DIAGNOSIS — E1169 Type 2 diabetes mellitus with other specified complication: Secondary | ICD-10-CM

## 2015-01-03 DIAGNOSIS — I10 Essential (primary) hypertension: Secondary | ICD-10-CM

## 2015-01-03 DIAGNOSIS — F32A Depression, unspecified: Secondary | ICD-10-CM

## 2015-01-03 DIAGNOSIS — R197 Diarrhea, unspecified: Secondary | ICD-10-CM

## 2015-01-03 DIAGNOSIS — E785 Hyperlipidemia, unspecified: Secondary | ICD-10-CM

## 2015-01-03 LAB — COMPLETE METABOLIC PANEL WITH GFR
ALT: 19 U/L (ref 6–29)
AST: 17 U/L (ref 10–30)
Albumin: 3.9 g/dL (ref 3.6–5.1)
Alkaline Phosphatase: 65 U/L (ref 33–115)
BILIRUBIN TOTAL: 0.3 mg/dL (ref 0.2–1.2)
BUN: 11 mg/dL (ref 7–25)
CALCIUM: 9.5 mg/dL (ref 8.6–10.2)
CHLORIDE: 100 mmol/L (ref 98–110)
CO2: 26 mmol/L (ref 20–31)
Creat: 0.79 mg/dL (ref 0.50–1.10)
Glucose, Bld: 149 mg/dL — ABNORMAL HIGH (ref 65–99)
Potassium: 4.3 mmol/L (ref 3.5–5.3)
Sodium: 136 mmol/L (ref 135–146)
TOTAL PROTEIN: 7.7 g/dL (ref 6.1–8.1)

## 2015-01-03 LAB — CBC WITH DIFFERENTIAL/PLATELET
Basophils Absolute: 0.1 K/uL (ref 0.0–0.1)
Basophils Relative: 1 % (ref 0–1)
Eosinophils Absolute: 0.3 K/uL (ref 0.0–0.7)
Eosinophils Relative: 3 % (ref 0–5)
HCT: 41.2 % (ref 36.0–46.0)
Hemoglobin: 13.9 g/dL (ref 12.0–15.0)
Lymphocytes Relative: 45 % (ref 12–46)
Lymphs Abs: 4 K/uL (ref 0.7–4.0)
MCH: 28.9 pg (ref 26.0–34.0)
MCHC: 33.7 g/dL (ref 30.0–36.0)
MCV: 85.7 fL (ref 78.0–100.0)
MPV: 9.8 fL (ref 8.6–12.4)
Monocytes Absolute: 0.4 K/uL (ref 0.1–1.0)
Monocytes Relative: 5 % (ref 3–12)
Neutro Abs: 4.1 K/uL (ref 1.7–7.7)
Neutrophils Relative %: 46 % (ref 43–77)
Platelets: 419 K/uL — ABNORMAL HIGH (ref 150–400)
RBC: 4.81 MIL/uL (ref 3.87–5.11)
RDW: 14 % (ref 11.5–15.5)
WBC: 8.9 K/uL (ref 4.0–10.5)

## 2015-01-03 LAB — HEMOGLOBIN A1C
HEMOGLOBIN A1C: 7.3 % — AB (ref <5.7)
MEAN PLASMA GLUCOSE: 163 mg/dL — AB (ref <117)

## 2015-01-03 LAB — TSH: TSH: 1.069 u[IU]/mL (ref 0.350–4.500)

## 2015-01-03 MED ORDER — FLUOXETINE HCL 20 MG PO TABS: 20.0000 mg | ORAL_TABLET | Freq: Every day | ORAL | Status: DC

## 2015-01-03 NOTE — Patient Instructions (Signed)
F/u in 3.5 month, call if you need me before  You will get medication today so that you will start feeling better, I am also referring you to Dr Harrington Challenger ,psychiatrist,   Loose stool I believe is linked to you increased stress and anxiety, we will test for stomach infection and refer you to Gi Doc also  Please work on good  health habits so that your health will improve. 1. Commitment to daily physical activity for 30 to 60  minutes, if you are able to do this.  2. Commitment to wise food choices. Aim for half of your  food intake to be vegetable and fruit, one quarter starchy foods, and one quarter protein. Try to eat on a regular schedule  3 meals per day, snacking between meals should be limited to vegetables or fruits or small portions of nuts. 64 ounces of water per day is generally recommended, unless you have specific health conditions, like heart failure or kidney failure where you will need to limit fluid intake.  3. Commitment to sufficient and a  good quality of physical and mental rest daily, generally between 6 to 8 hours per day.  WITH PERSISTANCE AND PERSEVERANCE, THE IMPOSSIBLE , BECOMES THE NORM!

## 2015-01-04 NOTE — Assessment & Plan Note (Signed)
3 week h/o diarrhea and loose stool, was recently at the [place], will test stool for any infection, and refer to GI also, likley stress related. Possible iBS , no prior h/o of this

## 2015-01-04 NOTE — Assessment & Plan Note (Signed)
Normotensive a visit off medication DASH diet and commitment to daily physical activity for a minimum of 30 minutes discussed and encouraged, as a part of hypertension management. The importance of attaining a healthy weight is also discussed.  BP/Weight 01/03/2015 03/04/2013 10/01/2012 10/01/2012 08/27/2012 08/13/2012 0/96/2836  Systolic BP 629 476 - 546 - 503 546  Diastolic BP 74 90 - 88 - 568 96  Wt. (Lbs) 214.12 183.12 193 197 203 216 224.25  BMI 37.94 32.45 34.2 34.91 35.97 38.27 39.73

## 2015-01-04 NOTE — Assessment & Plan Note (Signed)
Very irregular cycles, has bled twice in past 1 month, prior to this had no menses since her delivery 13 months ago, which is her norm

## 2015-01-04 NOTE — Assessment & Plan Note (Signed)
Deteriorated. Patient re-educated about  the importance of commitment to a  minimum of 150 minutes of exercise per week.  The importance of healthy food choices with portion control discussed. Encouraged to start a food diary, count calories and to consider  joining a support group. Sample diet sheets offered. Goals set by the patient for the next several months.   Weight /BMI 01/03/2015 03/04/2013 10/01/2012  WEIGHT 214 lb 1.9 oz 183 lb 1.9 oz 193 lb  HEIGHT 5\' 3"  5\' 3"  5\' 3"   BMI 37.94 kg/m2 32.45 kg/m2 34.2 kg/m2    Current exercise per week 30 minutes.Needs to address this as it is negatively impacting her health, depression needs to be treated also

## 2015-01-04 NOTE — Assessment & Plan Note (Signed)
Hyperlipidemia:Low fat diet discussed and encouraged. Low HDL, large abdominal girth, need to start exercise daily  Lipid Panel  Lab Results  Component Value Date   CHOL 154 02/19/2013   HDL 34* 02/19/2013   LDLCALC 99 02/19/2013   TRIG 103 02/19/2013   CHOLHDL 4.5 02/19/2013

## 2015-01-04 NOTE — Assessment & Plan Note (Signed)
Deteriorated. In the past pt has been intolerant of metformin, and with commitent to lifestyle had been able  To correct blood sugar will attempt this approach for initial 3 months

## 2015-01-04 NOTE — Progress Notes (Signed)
Deborah Sutton     MRN: [id number]      DOB: [birth year]   HPI Deborah Sutton is here for follow up and re-evaluation of chronic medical conditions, .  Preventive health is updated, specifically  Cancer screening and Immunization.   Pt was last seen in 2014, since then 13 months ago she delivered her 2nd child a daughter 93 years after her first child Deborah Sutton also lost her Father shortly around the time that her daugfghter was born, and having a 40 y/o and a 64 month old is in and of itself a major challenge that she is thankful to have to deal with but a BIG one She has noted increased irritability with family, uncharacteristic behavior, and multiple symptoms of depression,. Not suicidal or homicidal, has a lot of guilt about how she feels , since she knows that she is truly blessed,has never been diagnosed with depression before 3 week h/o loose watery stool , at times over 4 daily, infant had been ill but has recovered, she has no symptoms of viral illness  ROS Denies recent fever or chills. Denies sinus pressure, nasal congestion, ear pain or sore throat. Denies chest congestion, productive cough or wheezing. Denies chest pains, palpitations and leg swelling Denies dysuria, frequency, hesitancy or incontinence. Denies joint pain, swelling and limitation in mobility. Denies headaches, seizures, numbness, or tingling.  Denies skin break down or rash.   PE  BP 136/74 mmHg  Pulse 76  Resp 18  Ht 5\' 3"  (1.6 m)  Wt 214 lb 1.9 oz (97.124 kg)  BMI 37.94 kg/m2  SpO2 98%  Patient alert and oriented and in no cardiopulmonary distress.  HEENT: No facial asymmetry, EOMI,   oropharynx pink and moist.  Neck supple no JVD, no mass.  Chest: Clear to auscultation bilaterally.  CVS: S1, S2 no murmurs, no S3.Regular rate.  ABD: Soft non tender. Normal BS, no organomegaly or mass  Ext: No edema  MS: Adequate ROM spine, shoulders, hips and knees.  Skin: Intact, no ulcerations or rash  noted.  Psych: Good eye contact, normal affect often tearful and sobbing  Memory intact  Mildly anxious and  depressed appearing.  CNS: CN 2-12 intact, power,  normal throughout.no focal deficits noted.   Assessment & Plan   Depression Pt not suicidal or homicidal, however dealing with multiple stressors  In past 13 months none of which have been resolved, from death of her father , to birth of her 2nd child whose only other sibling is now 26 y/o. Deterioration in all areas of function, though states her performance at work has not suffered yet. Would benefit from therapy and medication , will refer to psychiatry as well as to therapist Start fluoxetine 20 mg daily  Obesity Deteriorated. Patient re-educated about  the importance of commitment to a  minimum of 150 minutes of exercise per week.  The importance of healthy food choices with portion control discussed. Encouraged to start a food diary, count calories and to consider  joining a support group. Sample diet sheets offered. Goals set by the patient for the next several months.   Weight /BMI 01/03/2015 03/04/2013 10/01/2012  WEIGHT 214 lb 1.9 oz 183 lb 1.9 oz 193 lb  HEIGHT 5\' 3"  5\' 3"  5\' 3"   BMI 37.94 kg/m2 32.45 kg/m2 34.2 kg/m2    Current exercise per week 30 minutes.Needs to address this as it is negatively impacting her health, depression needs to be treated also   DM type 2  with diabetic dyslipidemia Hyperlipidemia:Low fat diet discussed and encouraged. Low HDL, large abdominal girth, need to start exercise daily  Lipid Panel  Lab Results  Component Value Date   CHOL 154 02/19/2013   HDL 34* 02/19/2013   LDLCALC 99 02/19/2013   TRIG 103 02/19/2013   CHOLHDL 4.5 02/19/2013        Type 2 diabetes mellitus Deteriorated. In the past pt has been intolerant of metformin, and with commitent to lifestyle had been able  To correct blood sugar will attempt this approach for initial 3 months  POLYCYSTIC OVARIAN  DISEASE Very irregular cycles, has bled twice in past 1 month, prior to this had no menses since her delivery 13 months ago, which is her norm  HTN, goal below 130/80 Normotensive a visit off medication DASH diet and commitment to daily physical activity for a minimum of 30 minutes discussed and encouraged, as a part of hypertension management. The importance of attaining a healthy weight is also discussed.  BP/Weight 01/03/2015 03/04/2013 10/01/2012 10/01/2012 08/27/2012 08/13/2012 5/40/0867  Systolic BP 619 509 - 326 - 712 458  Diastolic BP 74 90 - 88 - 099 96  Wt. (Lbs) 214.12 183.12 193 197 203 216 224.25  BMI 37.94 32.45 34.2 34.91 35.97 38.27 39.73        Change in stool habits 3 week h/o diarrhea and loose stool[place] will [place] stool for any infection, and refer to GI also, likley stress related. Possible iBS , no prior h/o of this

## 2015-01-04 NOTE — Assessment & Plan Note (Addendum)
Pt not suicidal or homicidal, however dealing with multiple stressors  In past 13 months none of which have been resolved, from death of her father , to birth of her 2nd child whose only other sibling is now 40 y/o. Deterioration in all areas of function, though states her performance at work has not suffered yet. Would benefit from therapy and medication , will refer to psychiatry as well as to therapist Start fluoxetine 20 mg daily

## 2015-01-05 ENCOUNTER — Encounter: Payer: Self-pay | Admitting: Nurse Practitioner

## 2015-01-06 ENCOUNTER — Telehealth (HOSPITAL_COMMUNITY): Payer: Self-pay | Admitting: *Deleted

## 2015-01-18 ENCOUNTER — Telehealth: Payer: Self-pay | Admitting: *Deleted

## 2015-01-18 ENCOUNTER — Telehealth (HOSPITAL_COMMUNITY): Payer: Self-pay | Admitting: *Deleted

## 2015-01-18 NOTE — Telephone Encounter (Signed)
Left message for pt to call office to sch new pt appt.number provided

## 2015-01-18 NOTE — Telephone Encounter (Signed)
Pt called [facility]mom requesting to speak with Velna Hatchet [phone]

## 2015-01-19 NOTE — Telephone Encounter (Signed)
Patient aware on her labs

## 2015-01-20 ENCOUNTER — Ambulatory Visit: Payer: Self-pay | Admitting: Nurse Practitioner

## 2015-04-04 ENCOUNTER — Ambulatory Visit: Payer: Self-pay | Admitting: Family Medicine

## 2018-09-08 ENCOUNTER — Other Ambulatory Visit: Payer: Self-pay

## 2018-09-08 ENCOUNTER — Ambulatory Visit (HOSPITAL_COMMUNITY)
Admission: RE | Admit: 2018-09-08 | Discharge: 2018-09-08 | Disposition: A | Discharge status: Home / Self Care | Payer: BLUE CROSS/BLUE SHIELD | Source: Ambulatory Visit | Attending: Oncology | Admitting: Oncology

## 2018-09-08 ENCOUNTER — Other Ambulatory Visit: Payer: Self-pay | Admitting: Oncology

## 2018-09-08 DIAGNOSIS — I8290 Acute embolism and thrombosis of unspecified vein: Secondary | ICD-10-CM

## 2018-09-08 IMAGING — US RIGHT LOWER EXTREMITY VENOUS ULTRASOUND
1 series · 13 of 24 positions shown · non-contrast
Comparison: Prior duplex venous ultrasound of the right venous
system 08/06/2018

CLINICAL DATA: 42-year-old female with right lower extremity
tenderness for the past 3 days and a history of right lower
extremity DVT diagnosed 4-5 weeks previously. Patient is currently
14 weeks pregnant.



[Series 1: right lower extremity venous ultrasound · 13 of 42 slices shown]
[im 1/42]
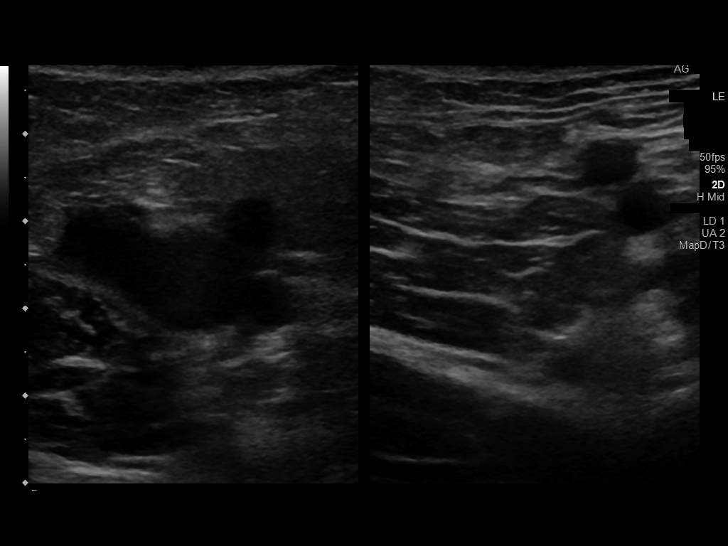
[im 4/42]
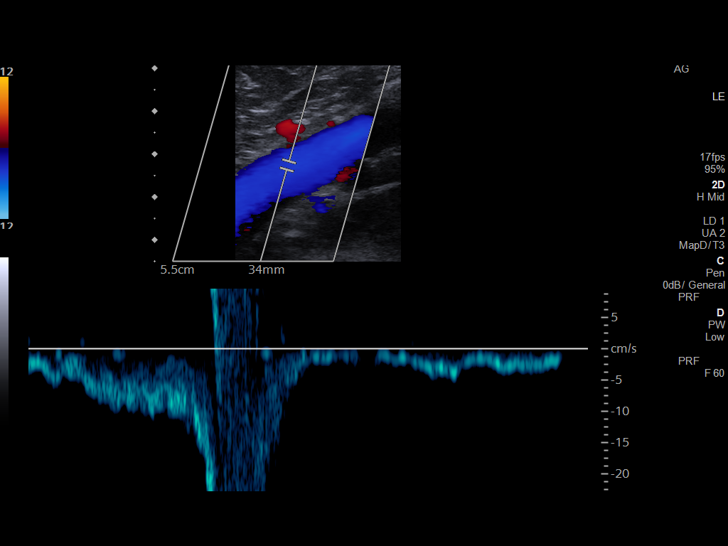
[im 8/42]
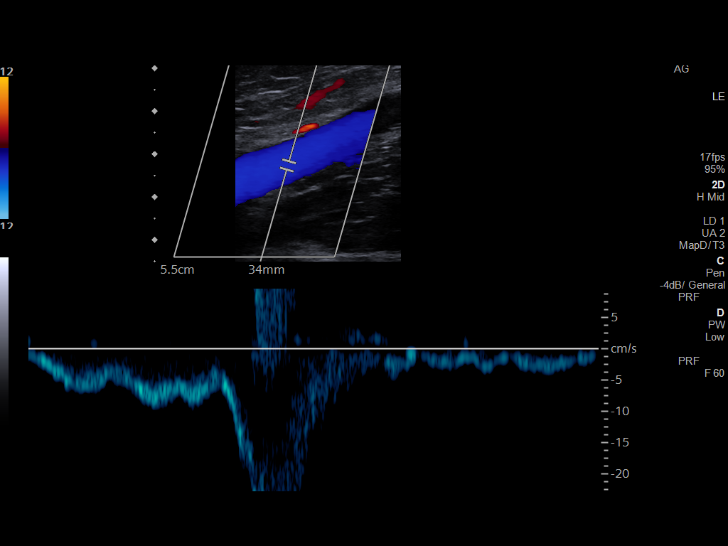
[im 11/42]
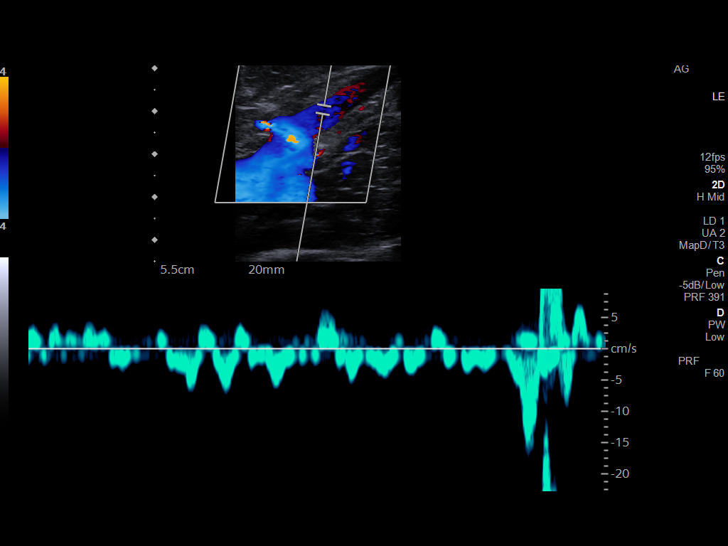
[im 15/42]
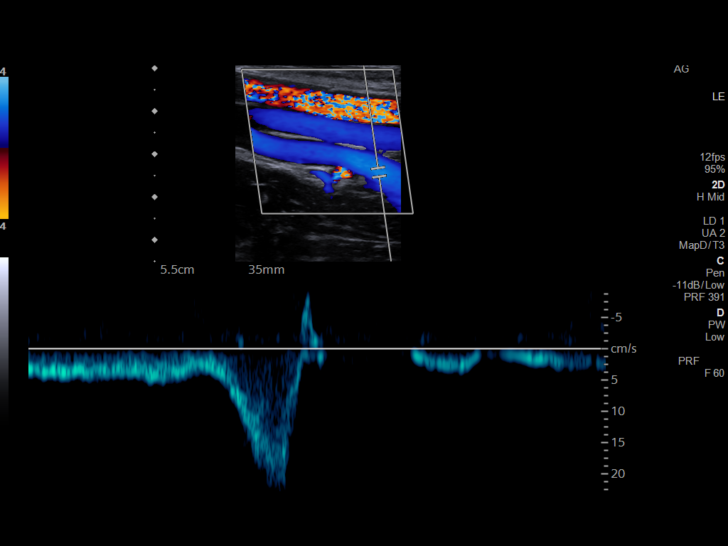
[im 18/42]
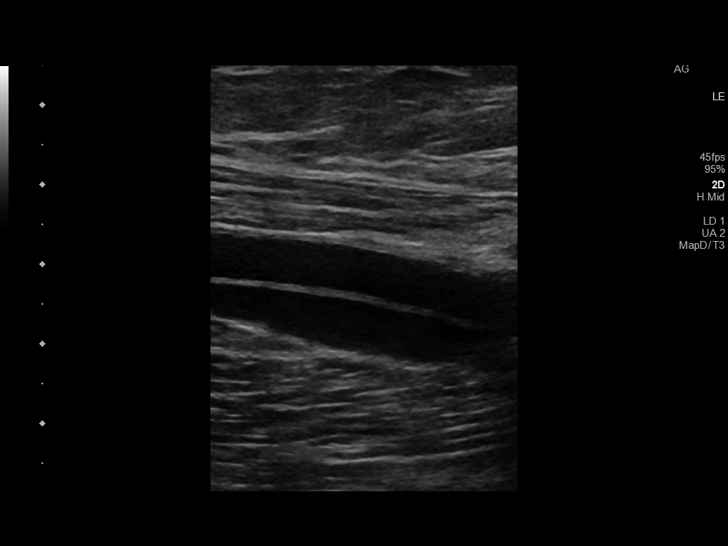
[im 22/42]
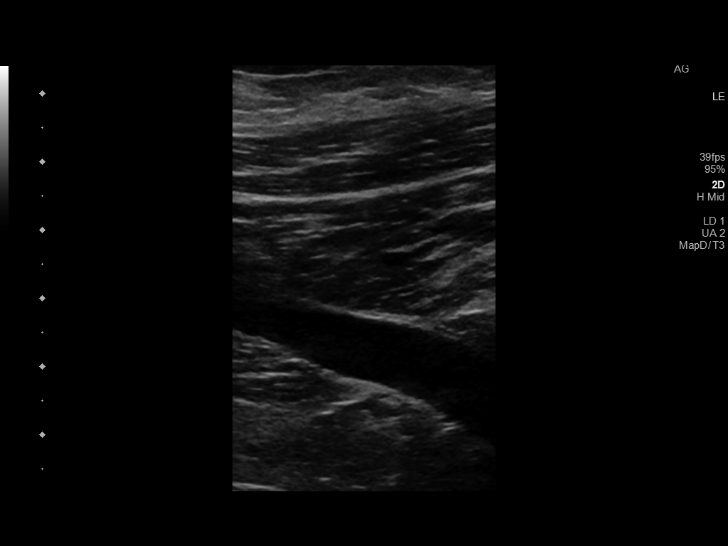
[im 24/42]
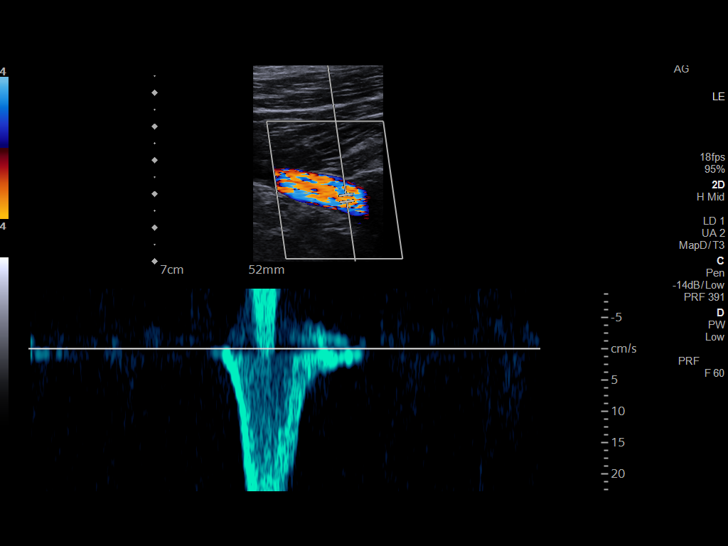
[im 27/42]
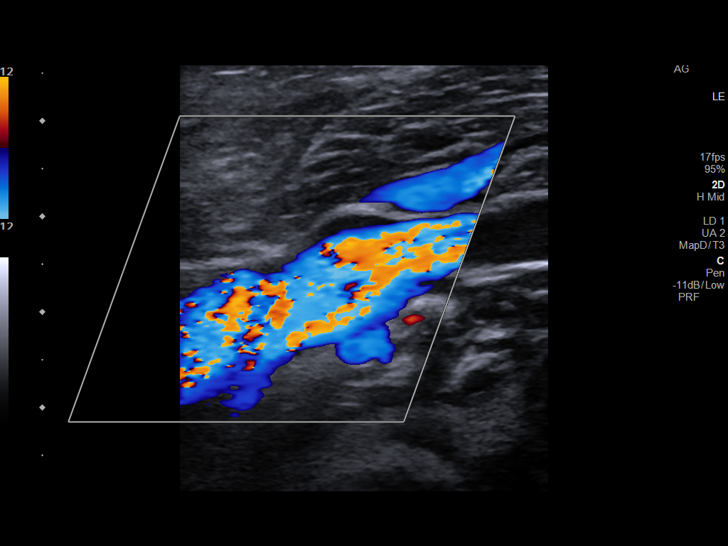
[im 31/42]
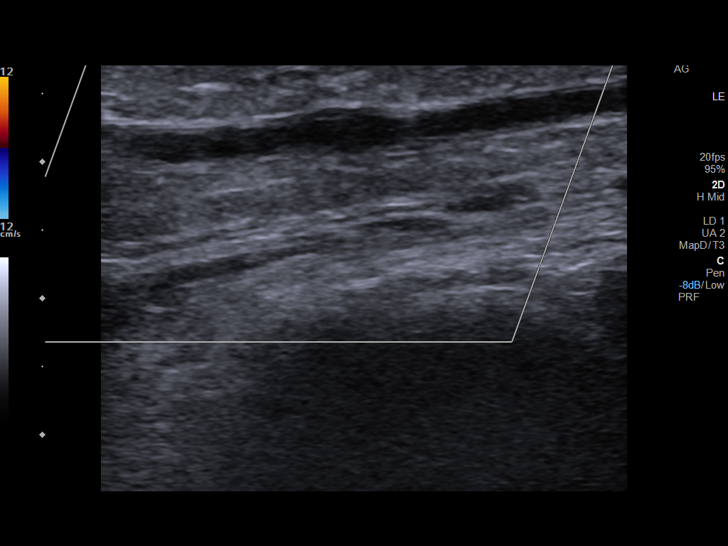
[im 34/42]
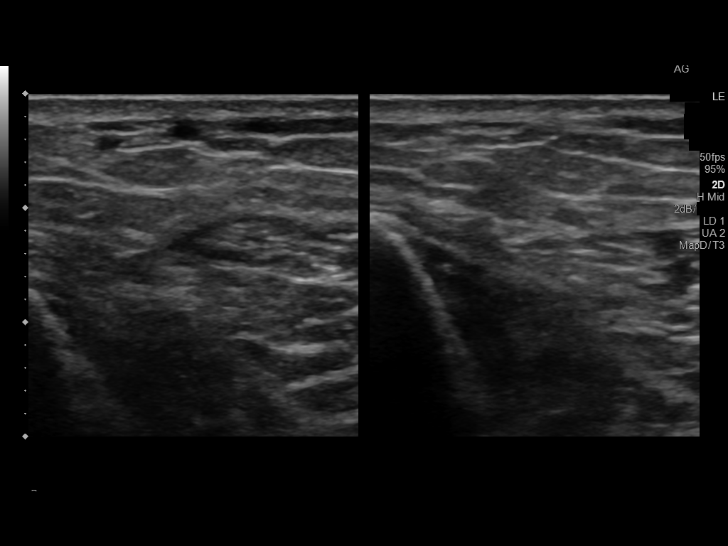
[im 38/42]
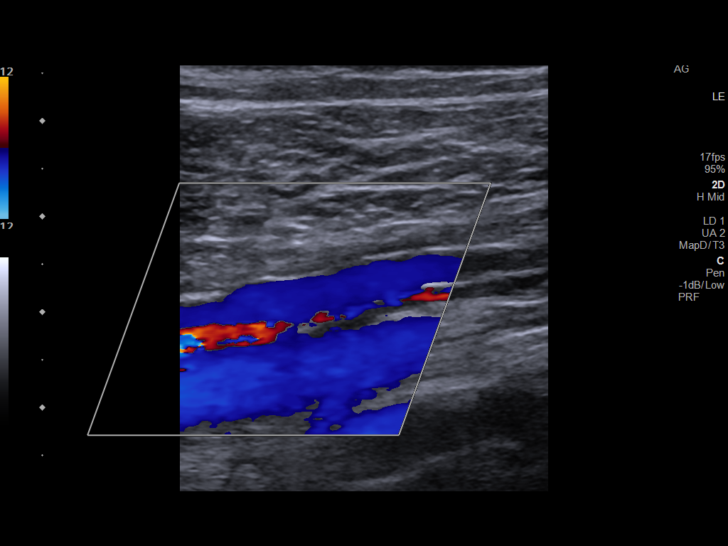
[im 42/42]
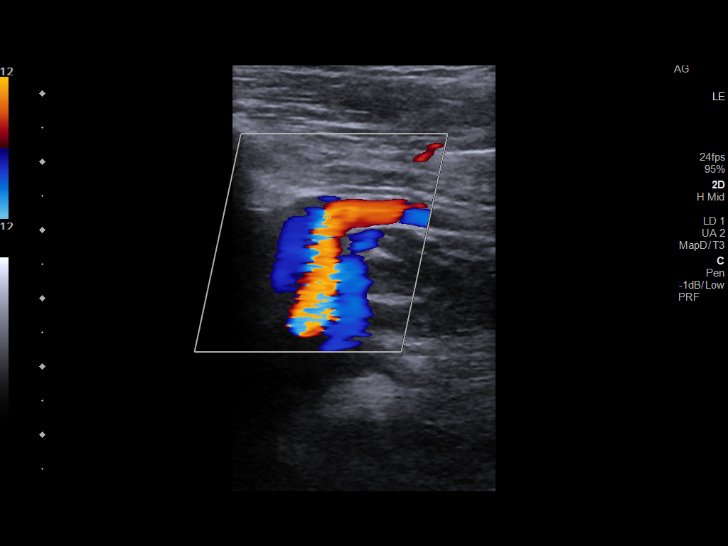

[13 of 24 positions shown; findings below may reference images not displayed]

FINDINGS: Contralateral Common Femoral Vein: Respiratory phasicity is normal
and symmetric with the symptomatic side. No evidence of thrombus.
Normal compressibility.

Common Femoral Vein: No evidence of thrombus. Normal
compressibility, respiratory phasicity and response to augmentation.

Saphenofemoral Junction: No evidence of thrombus. Normal
compressibility and flow on color Doppler imaging.

Profunda Femoral Vein: No evidence of thrombus. Normal
compressibility and flow on color Doppler imaging.

Femoral Vein: No evidence of thrombus. Normal compressibility,
respiratory phasicity and response to augmentation.

Popliteal Vein: No evidence of thrombus. Normal compressibility,
respiratory phasicity and response to augmentation.

Calf Veins: No evidence of thrombus. Normal compressibility and flow
on color Doppler imaging.

Superficial Great Saphenous Vein: Focal segment of the great
saphenous vein at the level of the knee is noncompressible in the
lumen is filled with low-level internal echoes. There is no evidence
of flow on color Doppler imaging.

Venous Reflux:  None.

Other Findings:  None.
IMPRESSION: 1. Positive for superficial thrombophlebitis involving a segment of
the great saphenous vein at the level of the knee. This is in
similar location to the previously identified SVT in the great
saphenous vein in the distal thigh. It is unclear if this represents
the same process, or a recurrent acute SVT.
2. No evidence of deep venous thrombosis in the right lower
extremity.

## 2020-02-29 ENCOUNTER — Other Ambulatory Visit (HOSPITAL_COMMUNITY): Payer: Self-pay | Admitting: Specialist

## 2020-02-29 DIAGNOSIS — R0789 Other chest pain: Secondary | ICD-10-CM

## 2020-02-29 DIAGNOSIS — K219 Gastro-esophageal reflux disease without esophagitis: Secondary | ICD-10-CM

## 2020-03-14 ENCOUNTER — Other Ambulatory Visit: Payer: Self-pay

## 2020-03-14 ENCOUNTER — Ambulatory Visit (HOSPITAL_COMMUNITY)
Admission: RE | Admit: 2020-03-14 | Discharge: 2020-03-14 | Disposition: A | Discharge status: Home / Self Care | Payer: BC Managed Care – PPO | Source: Ambulatory Visit | Attending: Family Medicine | Admitting: Family Medicine

## 2020-03-14 ENCOUNTER — Telehealth (HOSPITAL_COMMUNITY): Payer: Self-pay | Admitting: Speech Pathology

## 2020-03-14 ENCOUNTER — Encounter (HOSPITAL_COMMUNITY): Payer: Self-pay

## 2020-03-14 ENCOUNTER — Ambulatory Visit (HOSPITAL_COMMUNITY): Payer: BC Managed Care – PPO | Admitting: Speech Pathology

## 2020-03-14 ENCOUNTER — Other Ambulatory Visit (HOSPITAL_COMMUNITY): Payer: Self-pay | Admitting: Family Medicine

## 2020-03-14 DIAGNOSIS — R0789 Other chest pain: Secondary | ICD-10-CM

## 2020-03-14 DIAGNOSIS — K219 Gastro-esophageal reflux disease without esophagitis: Secondary | ICD-10-CM | POA: Insufficient documentation

## 2020-03-14 IMAGING — RF DG ESOPHAGUS
8 series · 12 of 24 positions shown · non-contrast
Comparison: None

CLINICAL DATA: Dysphagia, chest discomfort at throat sternum for a
month, pain all the time, GERD

EXAM:
ESOPHOGRAM / BARIUM SWALLOW / BARIUM TABLET STUDY
TECHNIQUE: Combined double contrast and single contrast examination performed
using effervescent crystals, thick barium liquid, and thin barium
liquid. The patient was observed with fluoroscopy swallowing a 13 mm
barium sulphate tablet.
FLUOROSCOPY TIME:  Fluoroscopy Time:  1 minutes 12 seconds
Radiation Exposure Index (if provided by the fluoroscopic device):
34.1 mGy
Number of Acquired Spot Images: multiple fluoroscopic screen
captures

[Series 1: cp_standard · 0.17mm/px · 1 of 112 frames shown (1 of 8)]
[frame 17/112]
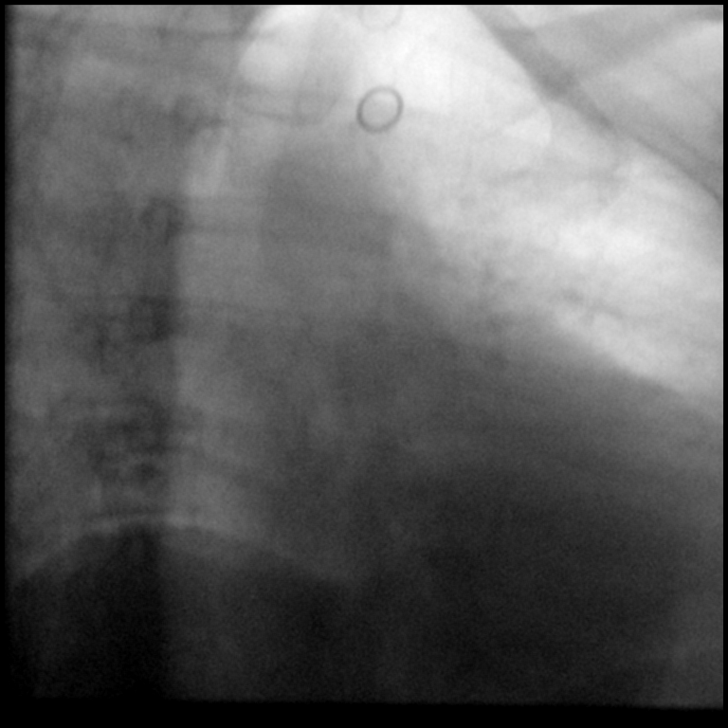

[Series 2: cp_standard · 0.17mm/px · 2 of 101 frames shown (2 of 8)]
[frame 1/101]
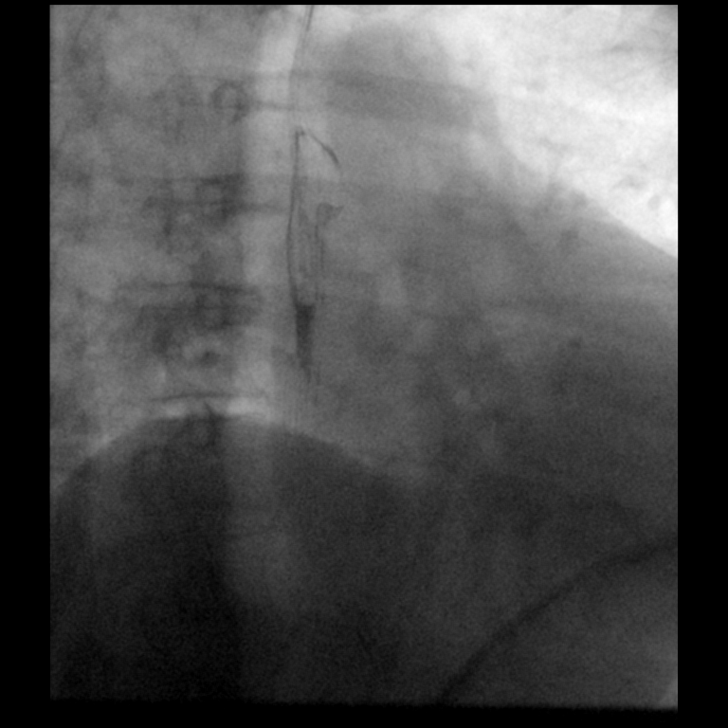
[frame 86/101]
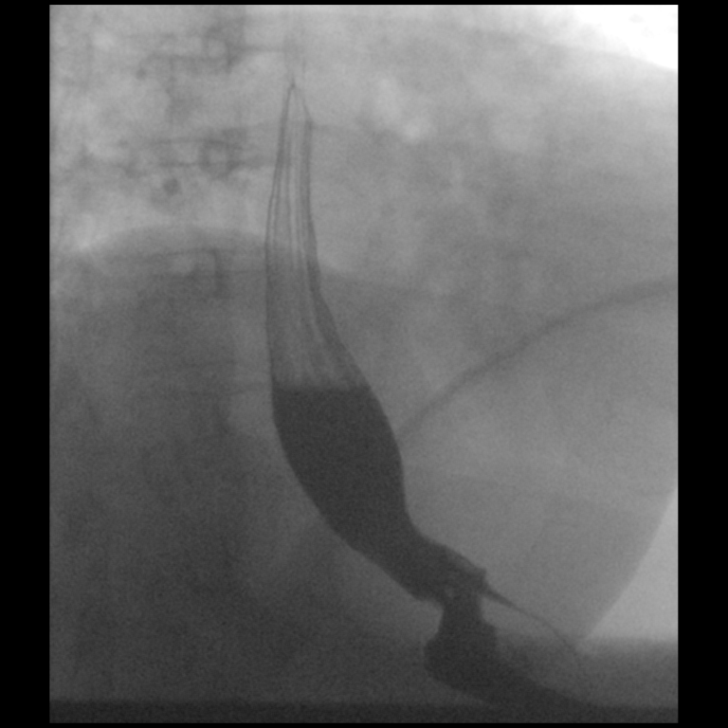

[Series 3: cp_standard · 0.17mm/px · 1 of 118 frames shown (3 of 8)]
[frame 18/118]
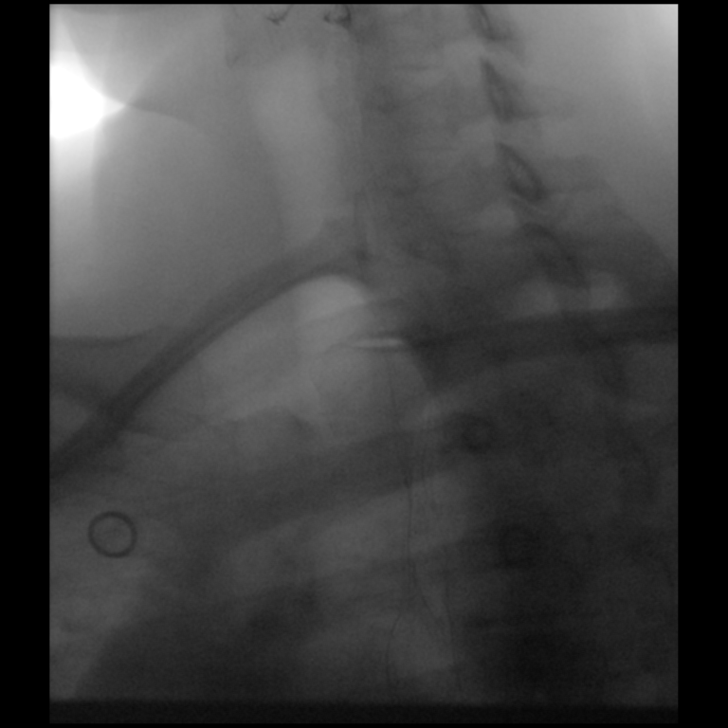

[Series 4: cp_standard · 0.26mm/px · 2 of 74 frames shown (4 of 8)]
[frame 1/74]
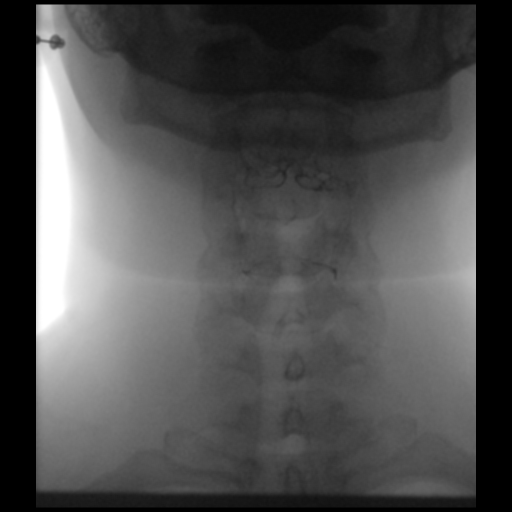
[frame 63/74]
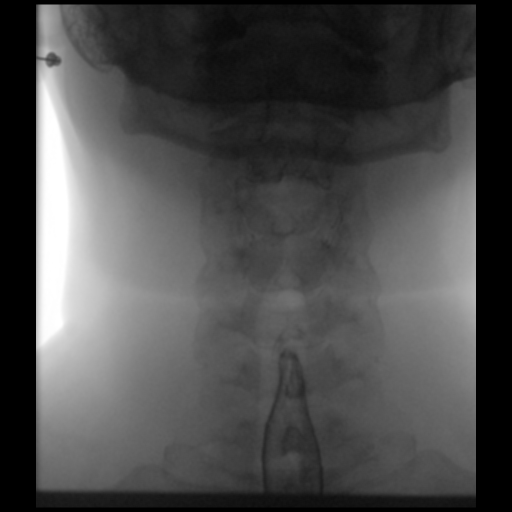

[Series 5: cp_standard · 0.26mm/px · 1 of 84 frames shown (5 of 8)]
[frame 43/84]
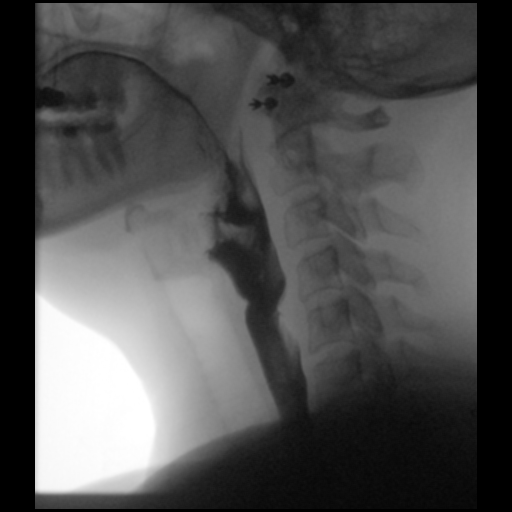

[Series 6: cp_standard · 0.17mm/px · 2 of 12 frames shown (6 of 8)]
[frame 2/12]
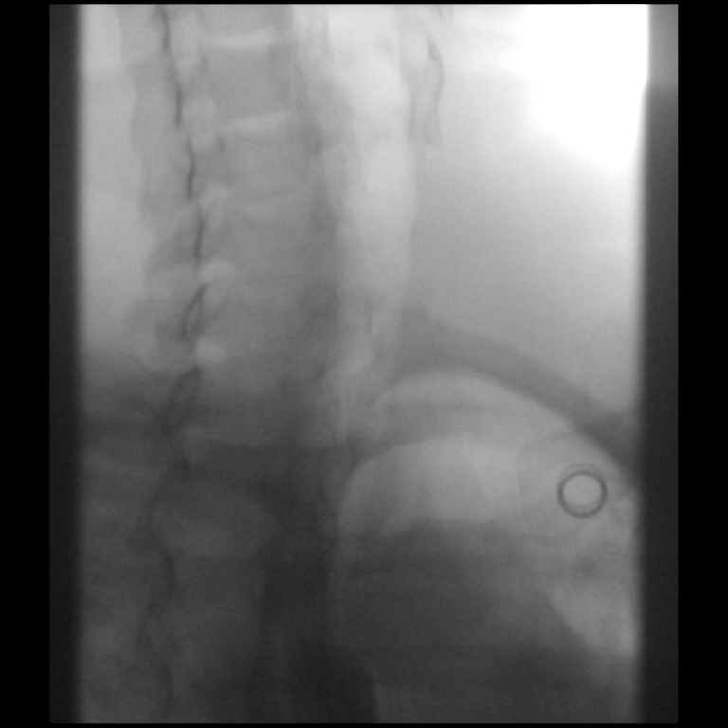
[frame 12/12]
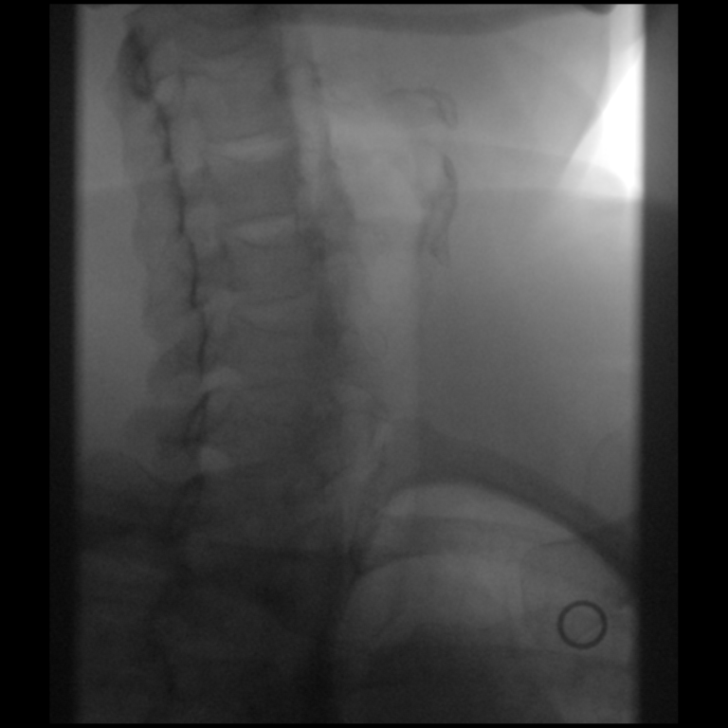

[Series 7: cp_standard · 0.17mm/px · 1 of 103 frames shown (7 of 8)]
[frame 52/103]
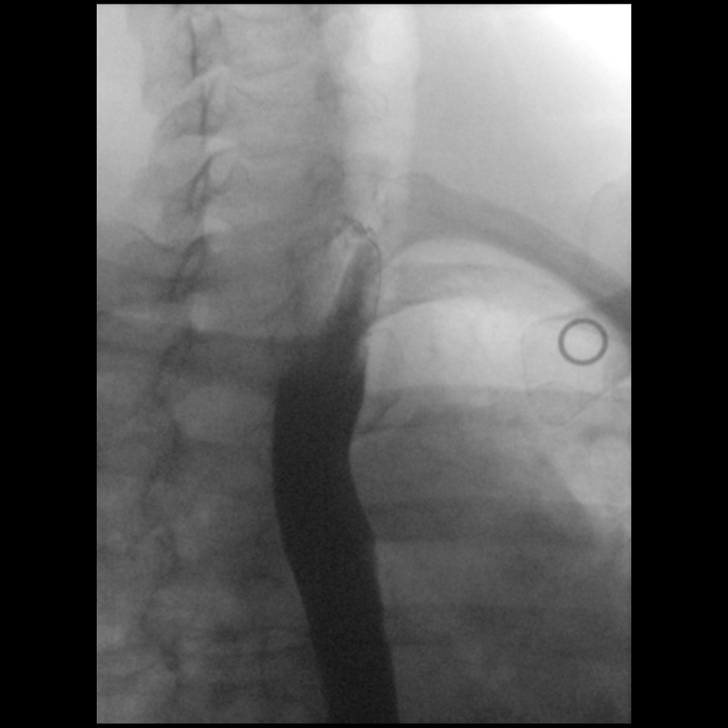

[Series 8: cp_standard · 0.17mm/px · 2 of 97 frames shown (8 of 8)]
[frame 15/97]
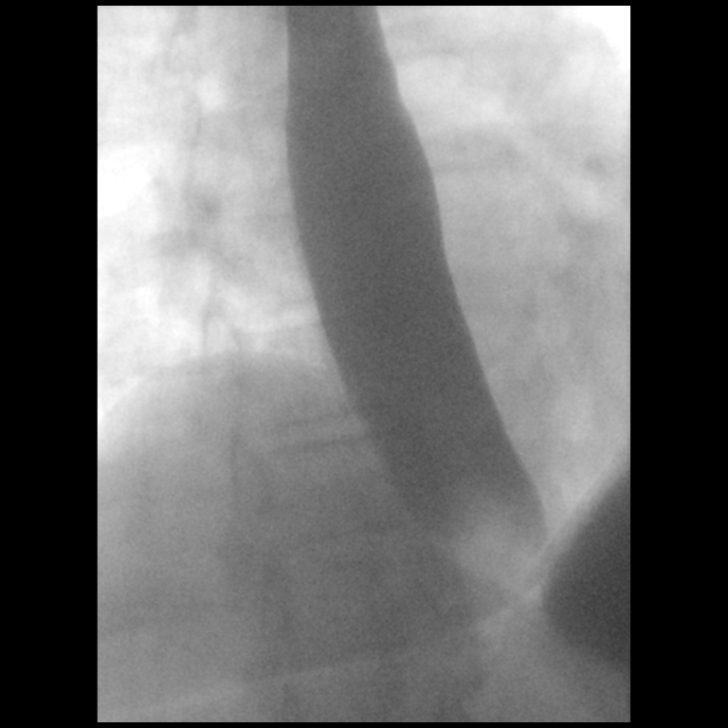
[frame 97/97]
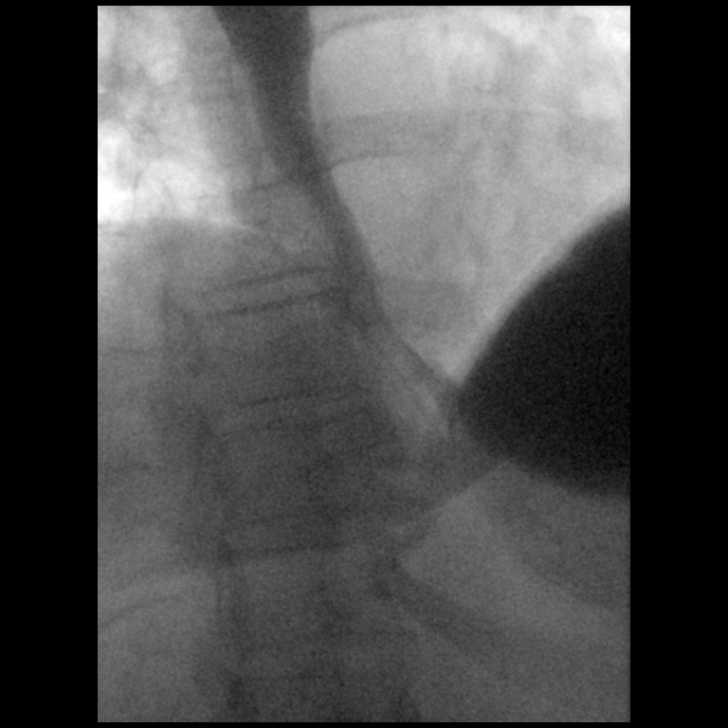

[12 of 24 positions shown; findings below may reference images not displayed]

FINDINGS: Esophageal distention: Normal without mass or stricture

Filling defects:  None

12.5 mm barium tablet: Easily passed from oral cavity to stomach
without obstruction

Motility: Incomplete clearance of barium by primary peristaltic
waves with patient horizontal LPO.

Mucosa:  Smooth without irregularity or ulceration

Hypopharynx/cervical esophagus: Normal. No laryngeal penetration,
aspiration, or residuals.

Hiatal hernia:  Absent

GE reflux:  Not witnessed during exam

Other:  N/A
IMPRESSION: Normal exam.

## 2020-03-14 NOTE — Telephone Encounter (Signed)
Deborah Sutton said to cx =patient doesn't need MBSS she needs BSS at Radiology.

## 2020-06-30 LAB — TSH: TSH: 0.86 (ref 0.41–5.90)

## 2020-06-30 LAB — HEMOGLOBIN A1C: Hemoglobin A1C: 7.6

## 2020-06-30 LAB — BASIC METABOLIC PANEL
BUN: 12 (ref 4–21)
Creatinine: 0.7 (ref 0.5–1.1)
Glucose: 128
Potassium: 4.4 (ref 3.4–5.3)

## 2020-06-30 LAB — COMPREHENSIVE METABOLIC PANEL: GFR calc non Af Amer: 103

## 2020-07-07 ENCOUNTER — Encounter: Payer: Self-pay | Admitting: Cardiology

## 2020-07-07 ENCOUNTER — Ambulatory Visit: Payer: BLUE CROSS/BLUE SHIELD | Admitting: Nurse Practitioner

## 2020-07-07 ENCOUNTER — Ambulatory Visit (INDEPENDENT_AMBULATORY_CARE_PROVIDER_SITE_OTHER): Payer: BC Managed Care – PPO | Admitting: Cardiology

## 2020-07-07 ENCOUNTER — Other Ambulatory Visit: Payer: Self-pay

## 2020-07-07 VITALS — BP 140/80 | HR 84 | Ht 63.0 in | Wt 214.0 lb

## 2020-07-07 DIAGNOSIS — R079 Chest pain, unspecified: Secondary | ICD-10-CM | POA: Diagnosis not present

## 2020-07-07 DIAGNOSIS — I10 Essential (primary) hypertension: Secondary | ICD-10-CM | POA: Diagnosis not present

## 2020-07-07 DIAGNOSIS — R0789 Other chest pain: Secondary | ICD-10-CM

## 2020-07-07 DIAGNOSIS — E1165 Type 2 diabetes mellitus with hyperglycemia: Secondary | ICD-10-CM

## 2020-07-07 NOTE — Patient Instructions (Signed)
Medication Instructions:  Your physician recommends that you continue on your current medications as directed. Please refer to the Current Medication list given to you today.  *If you need a refill on your cardiac medications before your next appointment, please call your pharmacy*   Lab Work: None today If you have labs (blood work) drawn today and your tests are completely normal, you will receive your results only by: Marland Kitchen MyChart Message (if you have MyChart) OR . A paper copy in the mail If you have any lab test that is abnormal or we need to change your treatment, we will call you to review the results.   Testing/Procedures: Your physician has requested that you have an echocardiogram. Echocardiography is a painless test that uses sound waves to create images of your heart. It provides your doctor with information about the size and shape of your heart and how well your heart's chambers and valves are working. This procedure takes approximately one hour. There are no restrictions for this procedure.  Your physician has requested that you have en exercise stress myoview. For further information please visit [email or web address]. Please follow instruction sheet, as given.    Follow-Up: At [facility], you and your health needs are our priority.  As part of our continuing mission to provide you with exceptional heart care, we have created designated Provider Care Teams.  These Care Teams include your primary Cardiologist (physician) and Advanced Practice Providers (APPs -  Physician Assistants and Nurse Practitioners) who all work together to provide you with the care you need, when you need it.  We recommend signing up for the patient portal called "MyChart".  Sign up information is provided on this After Visit Summary.  MyChart is used to connect with patients for Virtual Visits (Telemedicine).  Patients are able to view lab/test results, encounter notes, upcoming appointments, etc.   Non-urgent messages can be sent to your provider as well.   To learn more about what you can do with MyChart, go to [email or web address].    Your next appointment:  We will call you with results.     Thank you for choosing [facility] !

## 2020-07-07 NOTE — Progress Notes (Signed)
Cardiology Office Note  Date: 07/07/2020   ID: Kamillah, Didonato 27-Nov-1975, MRN [id number]  PCP:  Leeanne Rio, MD  Cardiologist:  Rozann Lesches, MD Electrophysiologist:  None   Chief Complaint  Patient presents with   Chest Pain    History of Present Illness: Deborah Sutton is a 45 y.o. female referred for cardiology consultation by Dr. Huel Cote with a history of hypertension and chest discomfort.  I reviewed the available records.  She tells me that she has a known history of hypertension, was on lisinopril until she got pregnant in 2020, switched to labetalol during her pregnancy.  She was ultimately found to have persistent elevations in blood pressure, also started to experience a pressure-like sensation in her upper chest and neck, was evaluated by her PCP in October 2021 for the symptoms.  This was felt to be potentially gastrointestinal in etiology, she did take antacids, although has still had recurring episodes of chest pressure.  These are not necessarily precipitated by any level of activity, can last for several hours to all day at times.  Also feels a discomfort between her shoulder blades occasionally.  She has been very anxious about her symptoms, discussed this with me today.  She is worried about the status of her heart.  She was just recently placed back on lisinopril 20 mg daily by PCP, has been on it for about a week.  Still reports elevations in blood pressure by home checks, today's measurements in the office looked better.  Additional history is outlined below, chart reviewed through [place].  I also reviewed her most recent lab work.  I do not have access to her tracing from October 2021.  I personally reviewed her ECG today which shows normal sinus rhythm, normal voltage and intervals.   Past Medical History:  Diagnosis Date   Anxiety    COVID-19    Positive test January 2022   Essential hypertension    GERD (gastroesophageal  reflux disease)    Polycystic ovarian syndrome    Type 2 diabetes mellitus (HCC)     Past Surgical History:  Procedure Laterality Date   CESAREAN SECTION  09/2000   CHOLECYSTECTOMY      Current Outpatient Medications  Medication Sig Dispense Refill   clonazePAM (KLONOPIN) 0.5 MG tablet Take 0.5 mg by mouth at bedtime.     lisinopril (ZESTRIL) 20 MG tablet Take 20 mg by mouth daily.     metFORMIN (GLUCOPHAGE) 500 MG tablet Take 500 mg by mouth 2 (two) times daily.     No current facility-administered medications for this visit.   Allergies:  Patient has no active allergies.   Social History: The patient  reports that she has never smoked. She has never used smokeless tobacco. She reports that she does not drink alcohol and does not use drugs.   Family History: The patient's family history includes Diabetes in her father; Hyperlipidemia in her father and mother; Hypertension in her father, mother, and sister.   ROS: No palpitations or syncope.  Physical Exam: VS:  BP 140/80 (BP Location: Left Arm)    Pulse 84    Ht 5\' 3"  (1.6 m)    Wt 214 lb (97.1 kg)    SpO2 98%    BMI 37.91 kg/m , BMI Body mass index is 37.91 kg/m.  Wt Readings from Last 3 Encounters:  07/07/20 214 lb (97.1 kg)  01/03/15 214 lb 1.9 oz (97.1 kg)  03/04/13 183 lb 1.9  oz (83.1 kg)    General: Patient appears comfortable at rest. HEENT: Conjunctiva and lids normal, wearing a mask. Neck: Supple, no elevated JVP or carotid bruits. Lungs: Clear to auscultation, nonlabored breathing at rest. Cardiac: Regular rate and rhythm, no S3 or significant systolic murmur, no pericardial rub. Abdomen: Bowel sounds present. Extremities: No pitting edema, distal pulses 2+. Skin: Warm and dry. Musculoskeletal: No kyphosis. Neuropsychiatric: Alert and oriented x3, affect grossly appropriate.  ECG:  No old tracings are available for review.  Recent Labwork:  September 2019: Cholesterol 177, triglycerides 294, HDL  34, LDL 84 February 2022: BUN 12, creatinine 0.71, potassium 4.4, AST 14, ALT 18, hemoglobin A1c 7.6%, TSH 0.85  Other Studies Reviewed Today:  No previous cardiac testing for review today.  Assessment and Plan:  1.  Essential hypertension by history.  Recent lab work shows normal renal function and potassium arguing against secondary causes.  She does have polycystic ovary syndrome as well.  At this point agree with initiation of lisinopril, can titrate further for more optimal control.  Use of ACE inhibitor in the setting of type 2 diabetes mellitus is also reasonable.  She may need addition of a low-dose diuretic such as chlorthalidone or Aldactone, will keep follow-up with PCP for further management.  Weight loss would also be beneficial.  2.  Recurrent episodes of chest discomfort as discussed above.  Fairly prolonged and atypical from the perspective of potential ischemic heart disease.  In the setting of hypertension and type 2 diabetes mellitus, further cardiac evaluation is warranted.  ECG shows normal sinus rhythm.  Plan is to obtain an echocardiogram to ensure structurally normal heart, also a Lexiscan Myoview for ischemic assessment.  3.  Type 2 diabetes mellitus, currently on Glucophage with follow-up by PCP.  Recent hemoglobin A1c 7.6%.  Medication Adjustments/Labs and Tests Ordered: Current medicines are reviewed at length with the patient today.  Concerns regarding medicines are outlined above.   Tests Ordered: Orders Placed This Encounter  Procedures   NM Myocar Multi W/Spect W/Wall Motion / EF   EKG 12-Lead   ECHOCARDIOGRAM COMPLETE    Medication Changes: No orders of the defined types were placed in this encounter.   Disposition:  Follow up test results.  Signed, Satira Sark, MD, [facility] 07/07/2020 9:29 AM    [facility] [address] [place], [place] [address] Phone: [phone]; Fax: [phone]

## 2020-07-07 NOTE — Patient Instructions (Incomplete)
Diabetes Mellitus and Nutrition, Adult When you have diabetes, or diabetes mellitus, it is very important to have healthy eating habits because your blood sugar (glucose) levels are greatly affected by what you eat and drink. Eating healthy foods in the right amounts, at about the same times every day, can help you:  Control your blood glucose.  Lower your risk of heart disease.  Improve your blood pressure.  Reach or maintain a healthy weight. What can affect my meal plan? Every person with diabetes is different, and each person has different needs for a meal plan. Your health care provider may recommend that you work with a dietitian to make a meal plan that is best for you. Your meal plan may vary depending on factors such as:  The calories you need.  The medicines you take.  Your weight.  Your blood glucose, blood pressure, and cholesterol levels.  Your activity level.  Other health conditions you have, such as heart or kidney disease. How do carbohydrates affect me? Carbohydrates, also called carbs, affect your blood glucose level more than any other type of food. Eating carbs naturally raises the amount of glucose in your blood. Carb counting is a method for keeping track of how many carbs you eat. Counting carbs is important to keep your blood glucose at a healthy level, especially if you use insulin or take certain oral diabetes medicines. It is important to know how many carbs you can safely have in each meal. This is different for every person. Your dietitian can help you calculate how many carbs you should have at each meal and for each snack. How does alcohol affect me? Alcohol can cause a sudden decrease in blood glucose (hypoglycemia), especially if you use insulin or take certain oral diabetes medicines. Hypoglycemia can be a life-threatening condition. Symptoms of hypoglycemia, such as sleepiness, dizziness, and confusion, are similar to symptoms of having too much  alcohol.  Do not drink alcohol if: ? Your health care provider tells you not to drink. ? You are pregnant, may be pregnant, or are planning to become pregnant.  If you drink alcohol: ? Do not drink on an empty stomach. ? Limit how much you use to:  0-1 drink a day for women.  0-2 drinks a day for men. ? Be aware of how much alcohol is in your drink. In the [place] one drink equals one 12 oz bottle of beer (355 mL), one 5 oz glass of wine (148 mL), or one 1 oz glass of hard liquor (44 mL). ? Keep yourself hydrated with water, diet soda, or unsweetened iced tea.  Keep in mind that regular soda, juice, and other mixers may contain a lot of sugar and must be counted as carbs. What are tips for following this plan? Reading food labels  Start by checking the serving size on the "Nutrition Facts" label of packaged foods and drinks. The amount of calories, carbs, fats, and other nutrients listed on the label is based on one serving of the item. Many items contain more than one serving per package.  Check the total grams (g) of carbs in one serving. You can calculate the number of servings of carbs in one serving by dividing the total carbs by 15. For example, if a food has 30 g of total carbs per serving, it would be equal to 2 servings of carbs.  Check the number of grams (g) of saturated fats and trans fats in one serving. Choose foods that have   a low amount or none of these fats.  Check the number of milligrams (mg) of salt (sodium) in one serving. Most people should limit total sodium intake to less than 2,300 mg per day.  Always check the nutrition information of foods labeled as "low-fat" or "nonfat." These foods may be higher in added sugar or refined carbs and should be avoided.  Talk to your dietitian to identify your daily goals for nutrients listed on the label. Shopping  Avoid buying canned, pre-made, or processed foods. These foods tend to be high in fat, sodium, and added  sugar.  Shop around the outside edge of the grocery store. This is where you will most often find fresh fruits and vegetables, bulk grains, fresh meats, and fresh dairy. Cooking  Use low-heat cooking methods, such as baking, instead of high-heat cooking methods like deep frying.  Cook using healthy oils, such as olive, canola, or sunflower oil.  Avoid cooking with butter, cream, or high-fat meats. Meal planning  Eat meals and snacks regularly, preferably at the same times every day. Avoid going long periods of time without eating.  Eat foods that are high in fiber, such as fresh fruits, vegetables, beans, and whole grains. Talk with your dietitian about how many servings of carbs you can eat at each meal.  Eat 4-6 oz (112-168 g) of lean protein each day, such as lean meat, chicken, fish, eggs, or tofu. One ounce (oz) of lean protein is equal to: ? 1 oz (28 g) of meat, chicken, or fish. ? 1 egg. ?  cup (62 g) of tofu.  Eat some foods each day that contain healthy fats, such as avocado, nuts, seeds, and fish.   What foods should I eat? Fruits Berries. Apples. Oranges. Peaches. Apricots. Plums. Grapes. Mango. Papaya. Pomegranate. Kiwi. Cherries. Vegetables Lettuce. Spinach. Leafy greens, including kale, chard, collard greens, and mustard greens. Beets. Cauliflower. Cabbage. Broccoli. Carrots. Green beans. Tomatoes. Peppers. Onions. Cucumbers. Brussels sprouts. Grains Whole grains, such as whole-wheat or whole-grain bread, crackers, tortillas, cereal, and pasta. Unsweetened oatmeal. Quinoa. Brown or wild rice. Meats and other proteins Seafood. Poultry without skin. Lean cuts of poultry and beef. Tofu. Nuts. Seeds. Dairy Low-fat or fat-free dairy products such as milk, yogurt, and cheese. The items listed above may not be a complete list of foods and beverages you can eat. Contact a dietitian for more information. What foods should I avoid? Fruits Fruits canned with  syrup. Vegetables Canned vegetables. Frozen vegetables with butter or cream sauce. Grains Refined white flour and flour products such as bread, pasta, snack foods, and cereals. Avoid all processed foods. Meats and other proteins Fatty cuts of meat. Poultry with skin. Breaded or fried meats. Processed meat. Avoid saturated fats. Dairy Full-fat yogurt, cheese, or milk. Beverages Sweetened drinks, such as soda or iced tea. The items listed above may not be a complete list of foods and beverages you should avoid. Contact a dietitian for more information. Questions to ask a health care provider  Do I need to meet with a diabetes educator?  Do I need to meet with a dietitian?  What number can I call if I have questions?  When are the best times to check my blood glucose? Where to find more information:  American Diabetes Association: diabetes.org  Academy of Nutrition and Dietetics: www.eatright.org  National Institute of Diabetes and Digestive and Kidney Diseases: www.niddk.nih.gov  Association of Diabetes Care and Education Specialists: www.diabeteseducator.org Summary  It is important to have healthy eating   habits because your blood sugar (glucose) levels are greatly affected by what you eat and drink.  A healthy meal plan will help you control your blood glucose and maintain a healthy lifestyle.  Your health care provider may recommend that you work with a dietitian to make a meal plan that is best for you.  Keep in mind that carbohydrates (carbs) and alcohol have immediate effects on your blood glucose levels. It is important to count carbs and to use alcohol carefully. This information is not intended to replace advice given to you by your health care provider. Make sure you discuss any questions you have with your health care provider. Document Revised: 04/14/2019 Document Reviewed: 04/14/2019 Elsevier Patient Education  2021 [employer]  

## 2020-07-08 ENCOUNTER — Ambulatory Visit: Payer: BC Managed Care – PPO | Admitting: Cardiology

## 2020-07-15 ENCOUNTER — Other Ambulatory Visit (HOSPITAL_COMMUNITY)
Admission: RE | Admit: 2020-07-15 | Discharge: 2020-07-15 | Disposition: A | Discharge status: Home / Self Care | Payer: BC Managed Care – PPO | Source: Ambulatory Visit | Attending: Cardiology | Admitting: Cardiology

## 2020-07-15 ENCOUNTER — Other Ambulatory Visit: Payer: Self-pay

## 2020-07-15 DIAGNOSIS — Z20822 Contact with and (suspected) exposure to covid-19: Secondary | ICD-10-CM | POA: Insufficient documentation

## 2020-07-15 DIAGNOSIS — Z01812 Encounter for preprocedural laboratory examination: Secondary | ICD-10-CM | POA: Diagnosis present

## 2020-07-16 LAB — SARS CORONAVIRUS 2 (TAT 6-24 HRS): SARS Coronavirus 2: NEGATIVE

## 2020-07-18 ENCOUNTER — Telehealth: Payer: Self-pay

## 2020-07-18 ENCOUNTER — Encounter (HOSPITAL_COMMUNITY): Payer: BC Managed Care – PPO

## 2020-07-18 ENCOUNTER — Ambulatory Visit (HOSPITAL_COMMUNITY): Payer: BC Managed Care – PPO

## 2020-07-18 NOTE — Telephone Encounter (Signed)
-----   Message from Satira Sark, MD sent at 07/17/2020 11:47 AM EST ----- Results reviewed.  Negative SARS coronavirus 2 test prior to stress testing.

## 2020-07-18 NOTE — Telephone Encounter (Signed)
Patient notified of results and verbalized understanding, however she has to reschedule her stress test due to sickness in the family along with having an upper respiratory infection herself.

## 2020-07-20 ENCOUNTER — Ambulatory Visit (HOSPITAL_COMMUNITY)
Admission: RE | Admit: 2020-07-20 | Discharge: 2020-07-20 | Disposition: A | Discharge status: Home / Self Care | Payer: BC Managed Care – PPO | Source: Ambulatory Visit | Attending: Cardiology | Admitting: Cardiology

## 2020-07-20 ENCOUNTER — Other Ambulatory Visit: Payer: Self-pay

## 2020-07-20 DIAGNOSIS — R079 Chest pain, unspecified: Secondary | ICD-10-CM | POA: Diagnosis present

## 2020-07-20 LAB — ECHOCARDIOGRAM COMPLETE
AR max vel: 2.06 cm2
AV Area VTI: 2.34 cm2
AV Area mean vel: 2.21 cm2
AV Mean grad: 5.2 mmHg
AV Peak grad: 10.3 mmHg
Ao pk vel: 1.6 m/s
Area-P 1/2: 3.85 cm2
S' Lateral: 2.2 cm

## 2020-07-20 NOTE — Progress Notes (Signed)
*  PRELIMINARY RESULTS* Echocardiogram 2D Echocardiogram has been performed.  Samuel [place] 07/20/2020, 9:12 AM

## 2020-07-26 ENCOUNTER — Other Ambulatory Visit: Payer: Self-pay

## 2020-07-26 ENCOUNTER — Encounter: Payer: Self-pay | Admitting: Nurse Practitioner

## 2020-07-26 ENCOUNTER — Ambulatory Visit (INDEPENDENT_AMBULATORY_CARE_PROVIDER_SITE_OTHER): Payer: BC Managed Care – PPO | Admitting: Nurse Practitioner

## 2020-07-26 VITALS — BP 163/98 | HR 81 | Ht 63.0 in | Wt 212.4 lb

## 2020-07-26 DIAGNOSIS — E119 Type 2 diabetes mellitus without complications: Secondary | ICD-10-CM

## 2020-07-26 DIAGNOSIS — I10 Essential (primary) hypertension: Secondary | ICD-10-CM

## 2020-07-26 DIAGNOSIS — E782 Mixed hyperlipidemia: Secondary | ICD-10-CM | POA: Diagnosis not present

## 2020-07-26 MED ORDER — OZEMPIC (0.25 OR 0.5 MG/DOSE) 2 MG/1.5ML ~~LOC~~ SOPN: 0.5000 mg | PEN_INJECTOR | SUBCUTANEOUS | 3 refills | Status: DC

## 2020-07-26 MED ORDER — ACCU-CHEK GUIDE VI STRP: ORAL_STRIP | 12 refills | Status: DC

## 2020-07-26 NOTE — Patient Instructions (Signed)
Diabetes Mellitus and Nutrition, Adult When you have diabetes, or diabetes mellitus, it is very important to have healthy eating habits because your blood sugar (glucose) levels are greatly affected by what you eat and drink. Eating healthy foods in the right amounts, at about the same times every day, can help you:  Control your blood glucose.  Lower your risk of heart disease.  Improve your blood pressure.  Reach or maintain a healthy weight. What can affect my meal plan? Every person with diabetes is different, and each person has different needs for a meal plan. Your health care provider may recommend that you work with a dietitian to make a meal plan that is best for you. Your meal plan may vary depending on factors such as:  The calories you need.  The medicines you take.  Your weight.  Your blood glucose, blood pressure, and cholesterol levels.  Your activity level.  Other health conditions you have, such as heart or kidney disease. How do carbohydrates affect me? Carbohydrates, also called carbs, affect your blood glucose level more than any other type of food. Eating carbs naturally raises the amount of glucose in your blood. Carb counting is a method for keeping track of how many carbs you eat. Counting carbs is important to keep your blood glucose at a healthy level, especially if you use insulin or take certain oral diabetes medicines. It is important to know how many carbs you can safely have in each meal. This is different for every person. Your dietitian can help you calculate how many carbs you should have at each meal and for each snack. How does alcohol affect me? Alcohol can cause a sudden decrease in blood glucose (hypoglycemia), especially if you use insulin or take certain oral diabetes medicines. Hypoglycemia can be a life-threatening condition. Symptoms of hypoglycemia, such as sleepiness, dizziness, and confusion, are similar to symptoms of having too much  alcohol.  Do not drink alcohol if: ? Your health care provider tells you not to drink. ? You are pregnant, may be pregnant, or are planning to become pregnant.  If you drink alcohol: ? Do not drink on an empty stomach. ? Limit how much you use to:  0-1 drink a day for women.  0-2 drinks a day for men. ? Be aware of how much alcohol is in your drink. In the [place] one drink equals one 12 oz bottle of beer (355 mL), one 5 oz glass of wine (148 mL), or one 1 oz glass of hard liquor (44 mL). ? Keep yourself hydrated with water, diet soda, or unsweetened iced tea.  Keep in mind that regular soda, juice, and other mixers may contain a lot of sugar and must be counted as carbs. What are tips for following this plan? Reading food labels  Start by checking the serving size on the "Nutrition Facts" label of packaged foods and drinks. The amount of calories, carbs, fats, and other nutrients listed on the label is based on one serving of the item. Many items contain more than one serving per package.  Check the total grams (g) of carbs in one serving. You can calculate the number of servings of carbs in one serving by dividing the total carbs by 15. For example, if a food has 30 g of total carbs per serving, it would be equal to 2 servings of carbs.  Check the number of grams (g) of saturated fats and trans fats in one serving. Choose foods that have  a low amount or none of these fats.  Check the number of milligrams (mg) of salt (sodium) in one serving. Most people should limit total sodium intake to less than 2,300 mg per day.  Always check the nutrition information of foods labeled as "low-fat" or "nonfat." These foods may be higher in added sugar or refined carbs and should be avoided.  Talk to your dietitian to identify your daily goals for nutrients listed on the label. Shopping  Avoid buying canned, pre-made, or processed foods. These foods tend to be high in fat, sodium, and added  sugar.  Shop around the outside edge of the grocery store. This is where you will most often find fresh fruits and vegetables, bulk grains, fresh meats, and fresh dairy. Cooking  Use low-heat cooking methods, such as baking, instead of high-heat cooking methods like deep frying.  Cook using healthy oils, such as olive, canola, or sunflower oil.  Avoid cooking with butter, cream, or high-fat meats. Meal planning  Eat meals and snacks regularly, preferably at the same times every day. Avoid going long periods of time without eating.  Eat foods that are high in fiber, such as fresh fruits, vegetables, beans, and whole grains. Talk with your dietitian about how many servings of carbs you can eat at each meal.  Eat 4-6 oz (112-168 g) of lean protein each day, such as lean meat, chicken, fish, eggs, or tofu. One ounce (oz) of lean protein is equal to: ? 1 oz (28 g) of meat, chicken, or fish. ? 1 egg. ?  cup (62 g) of tofu.  Eat some foods each day that contain healthy fats, such as avocado, nuts, seeds, and fish.   What foods should I eat? Fruits Berries. Apples. Oranges. Peaches. Apricots. Plums. Grapes. Mango. Papaya. Pomegranate. Kiwi. Cherries. Vegetables Lettuce. Spinach. Leafy greens, including kale, chard, collard greens, and mustard greens. Beets. Cauliflower. Cabbage. Broccoli. Carrots. Green beans. Tomatoes. Peppers. Onions. Cucumbers. Brussels sprouts. Grains Whole grains, such as whole-wheat or whole-grain bread, crackers, tortillas, cereal, and pasta. Unsweetened oatmeal. Quinoa. Brown or wild rice. Meats and other proteins Seafood. Poultry without skin. Lean cuts of poultry and beef. Tofu. Nuts. Seeds. Dairy Low-fat or fat-free dairy products such as milk, yogurt, and cheese. The items listed above may not be a complete list of foods and beverages you can eat. Contact a dietitian for more information. What foods should I avoid? Fruits Fruits canned with  syrup. Vegetables Canned vegetables. Frozen vegetables with butter or cream sauce. Grains Refined white flour and flour products such as bread, pasta, snack foods, and cereals. Avoid all processed foods. Meats and other proteins Fatty cuts of meat. Poultry with skin. Breaded or fried meats. Processed meat. Avoid saturated fats. Dairy Full-fat yogurt, cheese, or milk. Beverages Sweetened drinks, such as soda or iced tea. The items listed above may not be a complete list of foods and beverages you should avoid. Contact a dietitian for more information. Questions to ask a health care provider  Do I need to meet with a diabetes educator?  Do I need to meet with a dietitian?  What number can I call if I have questions?  When are the best times to check my blood glucose? Where to find more information:  American Diabetes Association: diabetes.org  Academy of Nutrition and Dietetics: www.eatright.[employer] of Diabetes and Digestive and Kidney Diseases: [employer]  Association of Diabetes Care and Education Specialists: www.diabeteseducator.org Summary  It is important to have healthy eating  habits because your blood sugar (glucose) levels are greatly affected by what you eat and drink.  A healthy meal plan will help you control your blood glucose and maintain a healthy lifestyle.  Your health care provider may recommend that you work with a dietitian to make a meal plan that is best for you.  Keep in mind that carbohydrates (carbs) and alcohol have immediate effects on your blood glucose levels. It is important to count carbs and to use alcohol carefully. This information is not intended to replace advice given to you by your health care provider. Make sure you discuss any questions you have with your health care provider. Document Revised: 04/14/2019 Document Reviewed: 04/14/2019 Elsevier Patient Education  2021 [employer].

## 2020-07-26 NOTE — Progress Notes (Signed)
Endocrinology Consult Note       07/26/2020, 12:43 PM   Subjective:    Patient ID: Deborah Sutton, female    DOB: [birth year].  Deborah Sutton is being seen in consultation for management of currently uncontrolled symptomatic diabetes requested by  Leeanne Rio, MD.   Past Medical History:  Diagnosis Date  . Anxiety   . COVID-19    Positive test January 2022  . Essential hypertension   . GERD (gastroesophageal reflux disease)   . Hypertension   . Polycystic ovarian syndrome   . Type 2 diabetes mellitus ([place])     Past Surgical History:  Procedure Laterality Date  . CESAREAN SECTION  09/2000  . CHOLECYSTECTOMY      Social History   Socioeconomic History  . Marital status: Married    Spouse name: Not on file  . Number of children: Not on file  . Years of education: Not on file  . Highest education level: Not on file  Occupational History  . Not on file  Tobacco Use  . Smoking status: Never Smoker  . Smokeless tobacco: Never Used  Vaping Use  . Vaping Use: Never used  Substance and Sexual Activity  . Alcohol use: No  . Drug use: No  . Sexual activity: Not on file  Other Topics Concern  . Not on file  Social History Narrative  . Not on file   Social Determinants of Health   Financial Resource Strain: Not on file  Food Insecurity: Not on file  Transportation Needs: Not on file  Physical Activity: Not on file  Stress: Not on file  Social Connections: Not on file    Family History  Problem Relation Age of Onset  . Hypertension Mother   . Hyperlipidemia Mother   . Hypertension Father   . Hyperlipidemia Father   . Diabetes Father   . Hypertension Sister     Outpatient Encounter Medications as of 07/26/2020  Medication Sig  . clonazePAM (KLONOPIN) 0.5 MG tablet Take 0.5 mg by mouth at bedtime.  Marland Kitchen glucose blood (ACCU-CHEK GUIDE) test strip Use as instructed to monitor  glucose at least twice daily  . lisinopril (ZESTRIL) 20 MG tablet Take 20 mg by mouth daily.  . metFORMIN (GLUCOPHAGE) 500 MG tablet Take 500 mg by mouth 2 (two) times daily.  . Semaglutide,0.25 or 0.5MG/DOS, (OZEMPIC, 0.25 OR 0.5 MG/DOSE,) 2 MG/1.5ML SOPN Inject 0.5 mg into the skin once a week.   No facility-administered encounter medications on file as of 07/26/2020.    ALLERGIES: No Known Allergies  VACCINATION STATUS: Immunization History  Administered Date(s) Administered  . Hep A / Hep B 03/22/2006  . Influenza Whole 03/19/2006  . Influenza,inj,Quad PF,6+ Mos 03/04/2013  . MMR 03/22/2006  . Td 03/22/2006    Diabetes She presents for her initial diabetic visit. She has type 2 diabetes mellitus. Onset time: Was diagnosed with Gestational diabetes with all of her children (most recent child born in 2020) and has not been able to regain control of her diabetes. Her disease course has been worsening. There are no hypoglycemic associated symptoms. Associated symptoms include fatigue. There are no hypoglycemic complications. Symptoms are stable.  There are no diabetic complications. Risk factors for coronary artery disease include diabetes mellitus, hypertension, obesity, stress and sedentary lifestyle. Current diabetic treatment includes oral agent (monotherapy). She is compliant with treatment most of the time. Her weight is stable. She is following a generally unhealthy diet. When asked about meal planning, she reported none. She has not had a previous visit with a dietitian. She rarely participates in exercise. (She presents today for her consultation, with no meter or logs to review.  She does not routinely monitor glucose at home as she is only on Metformin.  She reports she had gestational diabetes with all of her children (the most recent child born in 2020) and has not been able to regain control despite trying to control diet and medications.  She admits to drinking tea and juice along  with her water throughout the day, routinely eats 3 meals per day and snacks some.  She does not engage in routine physical exercise.  She denies any symptoms of hypoglycemia.) An ACE inhibitor/angiotensin II receptor blocker is being taken. She does not see a podiatrist.Eye exam is current.  Hypertension This is a chronic problem. The current episode started more than 1 year ago. The problem has been gradually worsening since onset. The problem is uncontrolled. There are no associated agents to hypertension. Risk factors for coronary artery disease include diabetes mellitus, sedentary lifestyle, stress and obesity. Past treatments include ACE inhibitors. The current treatment provides mild improvement. Compliance problems include diet and exercise.      Review of systems  Constitutional: + Minimally fluctuating body weight,  current Body mass index is 37.62 kg/m. , + fatigue, no subjective hyperthermia, no subjective hypothermia Eyes: no blurry vision, no xerophthalmia ENT: no sore throat, no nodules palpated in throat, no dysphagia/odynophagia, no hoarseness Cardiovascular: no chest pain, no shortness of breath, no palpitations, no leg swelling Respiratory: no cough, no shortness of breath Gastrointestinal: no nausea/vomiting/diarrhea Musculoskeletal: no muscle/joint aches Skin: no rashes, no hyperemia Neurological: no tremors, no numbness, no tingling, no dizziness Psychiatric: no depression, no anxiety  Objective:     BP (!) 163/98 (BP Location: Left Arm, Patient Position: Sitting)   Pulse 81   Ht '5\' 3"'  (1.6 m)   Wt 212 lb 6.4 oz (96.3 kg)   BMI 37.62 kg/m   Wt Readings from Last 3 Encounters:  07/26/20 212 lb 6.4 oz (96.3 kg)  07/07/20 214 lb (97.1 kg)  01/03/15 214 lb 1.9 oz (97.1 kg)     BP Readings from Last 3 Encounters:  07/26/20 (!) 163/98  07/07/20 140/80  01/03/15 136/74     Physical Exam- Limited  Constitutional:  Body mass index is 37.62 kg/m. , not in acute  distress, normal state of mind Eyes:  EOMI, no exophthalmos Neck: Supple Thyroid: No gross goiter Cardiovascular: RRR, no murmers, rubs, or gallops, no edema Respiratory: Adequate breathing efforts, no crackles, rales, rhonchi, or wheezing Musculoskeletal: no gross deformities, strength intact in all four extremities, no gross restriction of joint movements Skin:  no rashes, no hyperemia Neurological: no tremor with outstretched hands    CMP ( most recent) CMP     Component Value Date/Time   NA 136 01/03/2015 1202   K 4.4 06/30/2020 0000   CL 100 01/03/2015 1202   CO2 26 01/03/2015 1202   GLUCOSE 149 (H) 01/03/2015 1202   BUN 12 06/30/2020 0000   CREATININE 0.7 06/30/2020 0000   CREATININE 0.79 01/03/2015 1202   CALCIUM 9.5 01/03/2015 1202  PROT 7.7 01/03/2015 1202   ALBUMIN 3.9 01/03/2015 1202   AST 17 01/03/2015 1202   ALT 19 01/03/2015 1202   ALKPHOS 65 01/03/2015 1202   BILITOT 0.3 01/03/2015 1202   GFRNONAA 103 06/30/2020 0000   GFRNONAA >89 01/03/2015 1202   GFRAA >89 01/03/2015 1202     Diabetic Labs (most recent): Lab Results  Component Value Date   HGBA1C 7.6 06/30/2020   HGBA1C 7.3 (H) 01/03/2015   HGBA1C 6.0 (H) 02/19/2013     Lipid Panel ( most recent) Lipid Panel     Component Value Date/Time   CHOL 154 02/19/2013 0910   TRIG 103 02/19/2013 0910   HDL 34 (L) 02/19/2013 0910   CHOLHDL 4.5 02/19/2013 0910   VLDL 21 02/19/2013 0910   LDLCALC 99 02/19/2013 0910      Lab Results  Component Value Date   TSH 0.86 06/30/2020   TSH 1.069 01/03/2015   TSH 0.734 08/14/2012   TSH 0.649 12/10/2007   TSH 1.082 04/30/2007           Assessment & Plan:   1) Type 2 diabetes mellitus without complication, without long-term current use of insulin ([place])  She presents today for her consultation, with no meter or logs to review.  She does not routinely monitor glucose at home as she is only on Metformin.  She reports she had gestational diabetes with  all of her children (the most recent child born in 2020) and has not been able to regain control despite trying to control diet and medications.  She admits to drinking tea and juice along with her water throughout the day, routinely eats 3 meals per day and snacks some.  She does not engage in routine physical exercise.  She denies any symptoms of hypoglycemia.  - Deborah Sutton has currently uncontrolled symptomatic type 2 DM since 45 years of age, with most recent A1c of 7.6 %.   -Recent labs reviewed.  - I had a long discussion with her about the progressive nature of diabetes and the pathology behind its complications. -her diabetes is not currently complicated but she remains at a high risk for more acute and chronic complications which include CAD, CVA, CKD, retinopathy, and neuropathy. These are all discussed in detail with her.  - I have counseled her on diet  and weight management by adopting a carbohydrate restricted/protein rich diet. Patient is encouraged to switch to unprocessed or minimally processed complex starch and increased protein intake (animal or plant source), fruits, and vegetables. -  she is advised to stick to a routine mealtimes to eat 3 meals a day and avoid unnecessary snacks (to snack only to correct hypoglycemia).   - she acknowledges that there is a room for improvement in her food and drink choices. - Suggestion is made for her to avoid simple carbohydrates  from her diet including Cakes, Sweet Desserts, Ice Cream, Soda (diet and regular), Sweet Tea, Candies, Chips, Cookies, Store Bought Juices, Alcohol in Excess of  1-2 drinks a day, Artificial Sweeteners, Coffee Creamer, and "Sugar-free" Products. This will help patient to have more stable blood glucose profile and potentially avoid unintended weight gain.  - she will be scheduled with Jearld Fenton, RDN, CDE for diabetes education.  - I have approached her with the following individualized plan to manage   her diabetes and patient agrees:   --she is encouraged to start monitoring glucose 4 times daily, before meals and before bed, to log their readings on  the clinic sheets provided, and bring them to review at follow up appointment in 2 weeks.  - Adjustment parameters are given to her for hypo and hyperglycemia in writing. - she is encouraged to call clinic for blood glucose levels less than 70 or above 300 mg /dl. - she is advised to continue Metformin 500 mg po twice daily with meals, therapeutically suitable for patient (cannot increase because she cannot tolerate higher doses due to GI side effects).  -I also discussed and initiated incretin therapy with Ozempic 0.25 mg SQ weekly x 2 weeks, then advance to 0.5 mg SQ weekly thereafter if she tolerates it well.  Samples provided from office today.  Instructed her on proper injection technique.  - Specific targets for  A1c;  LDL, HDL,  and Triglycerides were discussed with the patient.  2) Blood Pressure /Hypertension:  her blood pressure is not controlled to target.   she is advised to continue her current medications including Lisinopril 20 mg p.o. daily with breakfast.  Will consider changing dose or adding agent at next visit if BP remains elevated.  3) Lipids/Hyperlipidemia:    There is no recent lipid panel to review.  She is not on any lipid lowering agents.  Will consider checking lipid panel on subsequent visits.   4)  Weight/Diet:  her Body mass index is 37.62 kg/m.  -  clearly complicating her diabetes care.   she is a candidate for weight loss. I discussed with her the fact that loss of 5 - 10% of her  current body weight will have the most impact on her diabetes management.  Exercise, and detailed carbohydrates information provided  -  detailed on discharge instructions.  5) Chronic Care/Health Maintenance: -she is on ACEI/ARB and is encouraged to initiate and continue to follow up with Ophthalmology, Dentist, Podiatrist at least  yearly or according to recommendations, and advised to stay away from smoking. I have recommended yearly flu vaccine and pneumonia vaccine at least every 5 years; moderate intensity exercise for up to 150 minutes weekly; and sleep for at least 7 hours a day.  - she is advised to maintain close follow up with Leeanne Rio, MD for primary care needs, as well as her other providers for optimal and coordinated care.   - Time spent in this patient care: 60 min, of which > 50% was spent in counseling her about her diabetes and the rest reviewing her blood glucose logs, discussing her hypoglycemia and hyperglycemia episodes, reviewing her current and previous labs/studies (including abstraction from other facilities) and medications doses and developing a long term treatment plan based on the latest standards of care/guidelines; and documenting her care.    Please refer to Patient Instructions for Blood Glucose Monitoring and Insulin/Medications Dosing Guide" in media tab for additional information. Please also refer to "Patient Self Inventory" in the Media tab for reviewed elements of pertinent patient history.  Deborah Sutton participated in the discussions, expressed understanding, and voiced agreement with the above plans.  All questions were answered to her satisfaction. she is encouraged to contact clinic should she have any questions or concerns prior to her return visit.   Follow up plan: - Return in about 2 weeks (around 08/09/2020) for Diabetes follow up, Bring glucometer and logs, ABI next visit, No previsit labs.  Deborah Sutton, [facility] [address] [place], [place] [address] Phone: [phone] Fax: [phone]  07/26/2020, 12:43 PM

## 2020-07-28 ENCOUNTER — Telehealth: Payer: Self-pay

## 2020-07-28 MED ORDER — ACCU-CHEK FASTCLIX LANCETS MISC: 1.0000 | Freq: Two times a day (BID) | 3 refills | Status: DC

## 2020-07-28 NOTE — Telephone Encounter (Signed)
Done

## 2020-07-28 NOTE — Telephone Encounter (Signed)
Patient said that she needs lancets called in. [employer] [place]

## 2020-07-29 ENCOUNTER — Other Ambulatory Visit: Payer: Self-pay

## 2020-07-29 MED ORDER — ACCU-CHEK SOFTCLIX LANCETS MISC: 12 refills | Status: DC

## 2020-07-29 MED ORDER — ACCU-CHEK SOFTCLIX LANCETS MISC
3 refills | Status: DC
Start: 2020-07-29 — End: 2021-06-22

## 2020-07-29 NOTE — Telephone Encounter (Signed)
Pt states fastclix was called in and she uses the softclix, she needs those called in

## 2020-07-29 NOTE — Telephone Encounter (Signed)
done

## 2020-07-29 NOTE — Addendum Note (Signed)
Addended by: Brita Romp on: 07/29/2020 10:19 AM   Modules accepted: Orders

## 2020-08-03 ENCOUNTER — Ambulatory Visit (HOSPITAL_COMMUNITY)
Admission: RE | Admit: 2020-08-03 | Discharge: 2020-08-03 | Disposition: A | Discharge status: Home / Self Care | Payer: BC Managed Care – PPO | Source: Ambulatory Visit | Attending: Cardiology | Admitting: Cardiology

## 2020-08-03 ENCOUNTER — Encounter (HOSPITAL_COMMUNITY): Payer: Self-pay

## 2020-08-03 ENCOUNTER — Encounter (HOSPITAL_COMMUNITY)
Admission: RE | Admit: 2020-08-03 | Discharge: 2020-08-03 | Disposition: A | Discharge status: Home / Self Care | Payer: BC Managed Care – PPO | Source: Ambulatory Visit | Attending: Cardiology | Admitting: Cardiology

## 2020-08-03 DIAGNOSIS — R079 Chest pain, unspecified: Secondary | ICD-10-CM | POA: Insufficient documentation

## 2020-08-03 LAB — NM MYOCAR MULTI W/SPECT W/WALL MOTION / EF
LV dias vol: 94 mL (ref 46–106)
LV sys vol: 38 mL
Peak HR: 121 {beats}/min
RATE: 0.32
Rest HR: 79 {beats}/min
SDS: 2
SRS: 1
SSS: 3
TID: 1.06

## 2020-08-03 MED ORDER — TECHNETIUM TC 99M TETROFOSMIN IV KIT
30.0000 | PACK | Freq: Once | INTRAVENOUS | Status: AC | PRN
  Administered 2020-08-03: 33 via INTRAVENOUS

## 2020-08-03 MED ORDER — TECHNETIUM TC 99M TETROFOSMIN IV KIT
10.0000 | PACK | Freq: Once | INTRAVENOUS | Status: AC | PRN
  Administered 2020-08-03: 10 via INTRAVENOUS

## 2020-08-03 MED ORDER — REGADENOSON 0.4 MG/5ML IV SOLN
INTRAVENOUS | Status: AC
  Administered 2020-08-03: 0.4 mg via INTRAVENOUS
  Filled 2020-08-03: qty 5

## 2020-08-03 MED ORDER — SODIUM CHLORIDE FLUSH 0.9 % IV SOLN
INTRAVENOUS | Status: AC
  Administered 2020-08-03: 10 mL via INTRAVENOUS
  Filled 2020-08-03: qty 10

## 2020-08-11 ENCOUNTER — Ambulatory Visit: Payer: BC Managed Care – PPO | Admitting: Nurse Practitioner

## 2020-08-11 ENCOUNTER — Other Ambulatory Visit: Payer: Self-pay

## 2020-08-11 ENCOUNTER — Encounter: Payer: Self-pay | Admitting: Nurse Practitioner

## 2020-08-11 VITALS — BP 174/106 | HR 74 | Ht 63.0 in | Wt 206.0 lb

## 2020-08-11 DIAGNOSIS — E119 Type 2 diabetes mellitus without complications: Secondary | ICD-10-CM

## 2020-08-11 DIAGNOSIS — I10 Essential (primary) hypertension: Secondary | ICD-10-CM

## 2020-08-11 DIAGNOSIS — E782 Mixed hyperlipidemia: Secondary | ICD-10-CM

## 2020-08-11 LAB — POCT UA - MICROALBUMIN: Microalbumin Ur, POC: 30 mg/L

## 2020-08-11 NOTE — Progress Notes (Signed)
Endocrinology Follow Up Note       08/11/2020, 10:24 AM   Subjective:    Patient ID: Deborah Sutton, female    DOB: 1975/10/03.  Deborah Sutton is being seen in follow up after being seen in consultation for management of currently uncontrolled symptomatic diabetes requested by  Leeanne Rio, MD.   Past Medical History:  Diagnosis Date  . Anxiety   . COVID-19    Positive test January 2022  . Essential hypertension   . GERD (gastroesophageal reflux disease)   . Hypertension   . Polycystic ovarian syndrome   . Type 2 diabetes mellitus ([place])     Past Surgical History:  Procedure Laterality Date  . CESAREAN SECTION  09/2000  . CHOLECYSTECTOMY      Social History   Socioeconomic History  . Marital status: Married    Spouse name: Not on file  . Number of children: Not on file  . Years of education: Not on file  . Highest education level: Not on file  Occupational History  . Not on file  Tobacco Use  . Smoking status: Never Smoker  . Smokeless tobacco: Never Used  Vaping Use  . Vaping Use: Never used  Substance and Sexual Activity  . Alcohol use: No  . Drug use: No  . Sexual activity: Not on file  Other Topics Concern  . Not on file  Social History Narrative  . Not on file   Social Determinants of Health   Financial Resource Strain: Not on file  Food Insecurity: Not on file  Transportation Needs: Not on file  Physical Activity: Not on file  Stress: Not on file  Social Connections: Not on file    Family History  Problem Relation Age of Onset  . Hypertension Mother   . Hyperlipidemia Mother   . Hypertension Father   . Hyperlipidemia Father   . Diabetes Father   . Hypertension Sister     Outpatient Encounter Medications as of 08/11/2020  Medication Sig  . Accu-Chek Softclix Lancets lancets Use as instructed to test blood glucose four times daily.DX E11.9  . clonazePAM  (KLONOPIN) 0.5 MG tablet Take 0.5 mg by mouth at bedtime.  Marland Kitchen glucose blood (ACCU-CHEK GUIDE) test strip Use as instructed to monitor glucose at least twice daily  . lisinopril (ZESTRIL) 40 MG tablet Take 40 mg by mouth daily.  . metFORMIN (GLUCOPHAGE) 500 MG tablet Take 500 mg by mouth 2 (two) times daily.  . Semaglutide,0.25 or 0.5MG/DOS, (OZEMPIC, 0.25 OR 0.5 MG/DOSE,) 2 MG/1.5ML SOPN Inject 0.5 mg into the skin once a week.  . [DISCONTINUED] lisinopril (ZESTRIL) 40 MG tablet Take 0.5 tablets (20 mg total) by mouth daily.   No facility-administered encounter medications on file as of 08/11/2020.    ALLERGIES: No Known Allergies  VACCINATION STATUS: Immunization History  Administered Date(s) Administered  . Hep A / Hep B 03/22/2006  . Influenza Whole 03/19/2006  . Influenza,inj,Quad PF,6+ Mos 03/04/2013  . MMR 03/22/2006  . Td 03/22/2006    Diabetes She presents for her follow-up diabetic visit. She has type 2 diabetes mellitus. Onset time: Was diagnosed with Gestational diabetes with all of her children (  most recent child born in 2020) and has not been able to regain control of her diabetes. Her disease course has been improving. There are no hypoglycemic associated symptoms. Associated symptoms include fatigue. There are no hypoglycemic complications. Symptoms are improving. There are no diabetic complications. Risk factors for coronary artery disease include diabetes mellitus, hypertension, obesity, stress and sedentary lifestyle. Current diabetic treatment includes oral agent (monotherapy). She is compliant with treatment most of the time. Her weight is decreasing steadily. She is following a generally healthy diet. Meal planning includes avoidance of concentrated sweets. She has not had a previous visit with a dietitian. She rarely participates in exercise. Her home blood glucose trend is decreasing steadily. Her breakfast blood glucose range is generally 110-130 mg/dl. Her lunch blood  glucose range is generally 110-130 mg/dl. Her dinner blood glucose range is generally 110-130 mg/dl. Her bedtime blood glucose range is generally 130-140 mg/dl. (She presents today with her meter and logs showing improved and stable glycemic profile overall.  There are no episodes of hypoglycemia to report.  She has tolerated the addition of Ozempic well along with lifestyle changes.) An ACE inhibitor/angiotensin II receptor blocker is being taken. She does not see a podiatrist.Eye exam is current.  Hypertension This is a chronic problem. The current episode started more than 1 year ago. The problem has been gradually worsening since onset. The problem is uncontrolled. There are no associated agents to hypertension. Risk factors for coronary artery disease include diabetes mellitus, sedentary lifestyle, stress and obesity. Past treatments include ACE inhibitors. The current treatment provides mild improvement. Compliance problems include diet and exercise.      Review of systems  Constitutional: + Minimally fluctuating body weight,  current Body mass index is 36.49 kg/m. , + fatigue, no subjective hyperthermia, no subjective hypothermia Eyes: no blurry vision, no xerophthalmia ENT: no sore throat, no nodules palpated in throat, no dysphagia/odynophagia, no hoarseness Cardiovascular: no chest pain, no shortness of breath, no palpitations, no leg swelling Respiratory: no cough, no shortness of breath Gastrointestinal: no nausea/vomiting/diarrhea Musculoskeletal: no muscle/joint aches Skin: no rashes, no hyperemia Neurological: no tremors, no numbness, no tingling, no dizziness Psychiatric: no depression, no anxiety  Objective:     BP (!) 174/106 (BP Location: Left Arm)   Pulse 74   Ht 5' 3" (1.6 m)   Wt 206 lb (93.4 kg)   LMP 08/03/2020 Comment: Tubal  BMI 36.49 kg/m   Wt Readings from Last 3 Encounters:  08/11/20 206 lb (93.4 kg)  07/26/20 212 lb 6.4 oz (96.3 kg)  07/07/20 214 lb  (97.1 kg)     BP Readings from Last 3 Encounters:  08/11/20 (!) 174/106  07/26/20 (!) 163/98  07/07/20 140/80     Physical Exam- Limited  Constitutional:  Body mass index is 36.49 kg/m. , not in acute distress, normal state of mind Eyes:  EOMI, no exophthalmos Neck: Supple Cardiovascular: RRR, no murmers, rubs, or gallops, no edema Respiratory: Adequate breathing efforts, no crackles, rales, rhonchi, or wheezing Musculoskeletal: no gross deformities, strength intact in all four extremities, no gross restriction of joint movements Skin:  no rashes, no hyperemia Neurological: no tremor with outstretched hands  POCT ABI Results 08/11/20   Right ABI:  1.17      Left ABI:  1.17  Right leg systolic / diastolic: 374/827 mmHg Left leg systolic / diastolic: 078/675 mmHg  Arm systolic / diastolic: 449/201 mmHG  Detailed report will be scanned into patient chart.  CMP ( most recent)  CMP     Component Value Date/Time   NA 136 01/03/2015 1202   K 4.4 06/30/2020 0000   CL 100 01/03/2015 1202   CO2 26 01/03/2015 1202   GLUCOSE 149 (H) 01/03/2015 1202   BUN 12 06/30/2020 0000   CREATININE 0.7 06/30/2020 0000   CREATININE 0.79 01/03/2015 1202   CALCIUM 9.5 01/03/2015 1202   PROT 7.7 01/03/2015 1202   ALBUMIN 3.9 01/03/2015 1202   AST 17 01/03/2015 1202   ALT 19 01/03/2015 1202   ALKPHOS 65 01/03/2015 1202   BILITOT 0.3 01/03/2015 1202   GFRNONAA 103 06/30/2020 0000   GFRNONAA >89 01/03/2015 1202   GFRAA >89 01/03/2015 1202     Diabetic Labs (most recent): Lab Results  Component Value Date   HGBA1C 7.6 06/30/2020   HGBA1C 7.3 (H) 01/03/2015   HGBA1C 6.0 (H) 02/19/2013     Lipid Panel ( most recent) Lipid Panel     Component Value Date/Time   CHOL 154 02/19/2013 0910   TRIG 103 02/19/2013 0910   HDL 34 (L) 02/19/2013 0910   CHOLHDL 4.5 02/19/2013 0910   VLDL 21 02/19/2013 0910   LDLCALC 99 02/19/2013 0910      Lab Results  Component Value Date   TSH 0.86  06/30/2020   TSH 1.069 01/03/2015   TSH 0.734 08/14/2012   TSH 0.649 12/10/2007   TSH 1.082 04/30/2007           Assessment & Plan:   1) Type 2 diabetes mellitus without complication, without long-term current use of insulin ([place])  She presents today with her meter and logs showing improved and stable glycemic profile overall.  There are no episodes of hypoglycemia to report.  She has tolerated the addition of Ozempic well along with lifestyle changes.  - Deborah Sutton has currently uncontrolled symptomatic type 2 DM since 45 years of age, with most recent A1c of 7.6 %.   -Recent labs reviewed.  - I had a long discussion with her about the progressive nature of diabetes and the pathology behind its complications. -her diabetes is not currently complicated but she remains at a high risk for more acute and chronic complications which include CAD, CVA, CKD, retinopathy, and neuropathy. These are all discussed in detail with her.  - Nutritional counseling repeated at each appointment due to patients tendency to fall back in to old habits.  - The patient admits there is a room for improvement in their diet and drink choices. -  Suggestion is made for the patient to avoid simple carbohydrates from their diet including Cakes, Sweet Desserts / Pastries, Ice Cream, Soda (diet and regular), Sweet Tea, Candies, Chips, Cookies, Sweet Pastries,  Store Bought Juices, Alcohol in Excess of  1-2 drinks a day, Artificial Sweeteners, Coffee Creamer, and "Sugar-free" Products. This will help patient to have stable blood glucose profile and potentially avoid unintended weight gain.   - I encouraged the patient to switch to  unprocessed or minimally processed complex starch and increased protein intake (animal or plant source), fruits, and vegetables.   - Patient is advised to stick to a routine mealtimes to eat 3 meals  a day and avoid unnecessary snacks ( to snack only to correct hypoglycemia).  -  she will be scheduled with Jearld Fenton, RDN, CDE for diabetes education.  - I have approached her with the following individualized plan to manage  her diabetes and patient agrees:   -Given her improved glycemic profile, her medication regimen  will stay the same with Metformin 500 mg po twice daily with meals and Ozempic 0.5 mg SQ weekly.  -She is encouraged to continue monitoring blood glucose at least once daily, before breakfast and to call the clinic if her readings are less than 70 or greater than 140 for 3 tests in a row.   - Specific targets for  A1c;  LDL, HDL,  and Triglycerides were discussed with the patient.  2) Blood Pressure /Hypertension:  her blood pressure is not controlled to target.   she is advised to continue her current medications including Lisinopril 20 mg p.o. daily with breakfast.  Will consider changing dose or adding agent at next visit if BP remains elevated.  3) Lipids/Hyperlipidemia:    There is no recent lipid panel to review.  She is not on any lipid lowering agents.  Will recheck lipid panel prior to next visit.    4)  Weight/Diet:  her Body mass index is 36.49 kg/m.  -  clearly complicating her diabetes care.   she is a candidate for weight loss. I discussed with her the fact that loss of 5 - 10% of her  current body weight will have the most impact on her diabetes management.  Exercise, and detailed carbohydrates information provided  -  detailed on discharge instructions.  5) Chronic Care/Health Maintenance: -she is on ACEI/ARB and is encouraged to initiate and continue to follow up with Ophthalmology, Dentist, Podiatrist at least yearly or according to recommendations, and advised to stay away from smoking. I have recommended yearly flu vaccine and pneumonia vaccine at least every 5 years; moderate intensity exercise for up to 150 minutes weekly; and sleep for at least 7 hours a day.  - she is advised to maintain close follow up with Leeanne Rio,  MD for primary care needs, as well as her other providers for optimal and coordinated care.   - Time spent on this patient care encounter:  40 min, of which > 50% was spent in  counseling and the rest reviewing her blood glucose logs , discussing her hypoglycemia and hyperglycemia episodes, reviewing her current and  previous labs / studies  ( including abstraction from other facilities) and medications  doses and developing a  long term treatment plan and documenting her care.   Please refer to Patient Instructions for Blood Glucose Monitoring and Insulin/Medications Dosing Guide"  in media tab for additional information. Please  also refer to " Patient Self Inventory" in the Media  tab for reviewed elements of pertinent patient history.  Deborah Sutton participated in the discussions, expressed understanding, and voiced agreement with the above plans.  All questions were answered to her satisfaction. she is encouraged to contact clinic should she have any questions or concerns prior to her return visit.   Follow up plan: - Return in about 4 months (around 12/11/2020) for Diabetes follow up with A1c in office, Previsit labs, Bring glucometer and logs.  Rayetta Pigg, [facility] [address] [place], [place] [address] Phone: [phone] Fax: [phone]  08/11/2020, 10:24 AM

## 2020-08-11 NOTE — Patient Instructions (Signed)

## 2020-12-14 NOTE — Patient Instructions (Signed)

## 2020-12-15 ENCOUNTER — Ambulatory Visit (INDEPENDENT_AMBULATORY_CARE_PROVIDER_SITE_OTHER): Payer: BC Managed Care – PPO | Admitting: Nurse Practitioner

## 2020-12-15 ENCOUNTER — Other Ambulatory Visit: Payer: Self-pay

## 2020-12-15 ENCOUNTER — Encounter: Payer: Self-pay | Admitting: Nurse Practitioner

## 2020-12-15 VITALS — BP 134/91 | HR 98 | Ht 63.0 in | Wt 180.8 lb

## 2020-12-15 DIAGNOSIS — E119 Type 2 diabetes mellitus without complications: Secondary | ICD-10-CM | POA: Diagnosis not present

## 2020-12-15 DIAGNOSIS — E782 Mixed hyperlipidemia: Secondary | ICD-10-CM

## 2020-12-15 DIAGNOSIS — I10 Essential (primary) hypertension: Secondary | ICD-10-CM | POA: Diagnosis not present

## 2020-12-15 LAB — POCT GLYCOSYLATED HEMOGLOBIN (HGB A1C): HbA1c, POC (controlled diabetic range): 5.7 % (ref 0.0–7.0)

## 2020-12-15 MED ORDER — SEMAGLUTIDE (1 MG/DOSE) 4 MG/3ML ~~LOC~~ SOPN: 1.0000 mg | PEN_INJECTOR | SUBCUTANEOUS | 3 refills | Status: DC

## 2020-12-15 NOTE — Progress Notes (Signed)
Endocrinology Follow Up Note       12/15/2020, 9:35 AM   Subjective:    Patient ID: Deborah Sutton, female    DOB: December 08, 1975.  Deborah Sutton is being seen in follow up after being seen in consultation for management of currently uncontrolled symptomatic diabetes requested by  Leeanne Rio, MD.   Past Medical History:  Diagnosis Date   Anxiety    COVID-19    Positive test January 2022   Essential hypertension    GERD (gastroesophageal reflux disease)    Hypertension    Polycystic ovarian syndrome    Type 2 diabetes mellitus ([place])     Past Surgical History:  Procedure Laterality Date   CESAREAN SECTION  09/2000   CHOLECYSTECTOMY      Social History   Socioeconomic History   Marital status: Married    Spouse name: Not on file   Number of children: Not on file   Years of education: Not on file   Highest education level: Not on file  Occupational History   Not on file  Tobacco Use   Smoking status: Never   Smokeless tobacco: Never  Vaping Use   Vaping Use: Never used  Substance and Sexual Activity   Alcohol use: No   Drug use: No   Sexual activity: Not on file  Other Topics Concern   Not on file  Social History Narrative   Not on file   Social Determinants of Health   Financial Resource Strain: Not on file  Food Insecurity: Not on file  Transportation Needs: Not on file  Physical Activity: Not on file  Stress: Not on file  Social Connections: Not on file    Family History  Problem Relation Age of Onset   Hypertension Mother    Hyperlipidemia Mother    Hypertension Father    Hyperlipidemia Father    Diabetes Father    Hypertension Sister     Outpatient Encounter Medications as of 12/15/2020  Medication Sig   Cholecalciferol (VITAMIN D3) 25 MCG (1000 UT) CAPS Take 1 capsule by mouth daily in the afternoon.   Multiple Vitamin (MULTIVITAMIN ADULT PO) Take 1 tablet by  mouth daily in the afternoon.   Semaglutide, 1 MG/DOSE, 4 MG/3ML SOPN Inject 1 mg as directed once a week.   Accu-Chek Softclix Lancets lancets Use as instructed to test blood glucose four times daily.DX E11.9   clonazePAM (KLONOPIN) 0.5 MG tablet Take 0.5 mg by mouth at bedtime. (Patient not taking: Reported on 12/15/2020)   glucose blood (ACCU-CHEK GUIDE) test strip Use as instructed to monitor glucose at least twice daily   lisinopril (ZESTRIL) 40 MG tablet Take 40 mg by mouth daily.   metFORMIN (GLUCOPHAGE) 500 MG tablet Take 500 mg by mouth 2 (two) times daily.   [DISCONTINUED] Semaglutide,0.25 or 0.5MG/DOS, (OZEMPIC, 0.25 OR 0.5 MG/DOSE,) 2 MG/1.5ML SOPN Inject 0.5 mg into the skin once a week.   No facility-administered encounter medications on file as of 12/15/2020.    ALLERGIES: No Known Allergies  VACCINATION STATUS: Immunization History  Administered Date(s) Administered   Hep A / Hep B 03/22/2006   Influenza Whole 03/19/2006   Influenza,inj,Quad PF,6+  Mos 03/04/2013   MMR 03/22/2006   Td 03/22/2006    Diabetes She presents for her follow-up diabetic visit. She has type 2 diabetes mellitus. Onset time: Was diagnosed with Gestational diabetes with all of her children (most recent child born in 2020) and has not been able to regain control of her diabetes. Her disease course has been improving. There are no hypoglycemic associated symptoms. Associated symptoms include weight loss. Pertinent negatives for diabetes include no fatigue. There are no hypoglycemic complications. Symptoms are improving. There are no diabetic complications. Pertinent negatives for diabetic complications include no nephropathy. Risk factors for coronary artery disease include diabetes mellitus, hypertension, obesity, stress, sedentary lifestyle and dyslipidemia. Current diabetic treatment includes oral agent (monotherapy). She is compliant with treatment most of the time. Her weight is decreasing steadily.  She is following a generally healthy diet. Meal planning includes avoidance of concentrated sweets. She has not had a previous visit with a dietitian. She rarely participates in exercise. Her home blood glucose trend is decreasing steadily. Her breakfast blood glucose range is generally 90-110 mg/dl. (She presents today with her meter and logs showing drastic improvement in her glycemic profile overall.  Her POCT A1c today is 5.7%, improving from last visit of 7.6%.  She has lost approx 26 lbs since last visit and has plans to continue.  She is listening to her body and eating healthier foods and started incorporating more physical exercise.  She denies any hypoglycemia.) An ACE inhibitor/angiotensin II receptor blocker is being taken. She does not see a podiatrist.Eye exam is current.  Hypertension This is a chronic problem. The current episode started more than 1 year ago. The problem has been gradually improving since onset. The problem is uncontrolled. There are no associated agents to hypertension. Risk factors for coronary artery disease include diabetes mellitus, sedentary lifestyle, stress, obesity and dyslipidemia. Past treatments include ACE inhibitors. The current treatment provides mild improvement. Compliance problems include diet and exercise.   Hyperlipidemia This is a chronic problem. The current episode started more than 1 year ago. The problem is uncontrolled. Recent lipid tests were reviewed and are high. Exacerbating diseases include diabetes. Factors aggravating her hyperlipidemia include fatty foods. She is currently on no antihyperlipidemic treatment. Compliance problems include adherence to diet and adherence to exercise.  Risk factors for coronary artery disease include diabetes mellitus, dyslipidemia, family history and hypertension.    Review of systems  Constitutional: + steadily decreasing body weight,  current Body mass index is 32.03 kg/m. , no fatigue, no subjective  hyperthermia, no subjective hypothermia Eyes: no blurry vision, no xerophthalmia ENT: no sore throat, no nodules palpated in throat, no dysphagia/odynophagia, no hoarseness Cardiovascular: no chest pain, no shortness of breath, no palpitations, no leg swelling Respiratory: no cough, no shortness of breath Gastrointestinal: no nausea/vomiting/diarrhea Musculoskeletal: no muscle/joint aches, complains of restless legs in the evening impacting her ability to sleep Skin: no rashes, no hyperemia Neurological: no tremors, no numbness, no tingling, no dizziness Psychiatric: no depression, no anxiety    Objective:     BP (!) 134/91   Pulse 98   Ht '5\' 3"'  (1.6 m)   Wt 180 lb 12.8 oz (82 kg)   BMI 32.03 kg/m   Wt Readings from Last 3 Encounters:  12/15/20 180 lb 12.8 oz (82 kg)  08/11/20 206 lb (93.4 kg)  07/26/20 212 lb 6.4 oz (96.3 kg)     BP Readings from Last 3 Encounters:  12/15/20 (!) 134/91  08/11/20 (!) 174/106  07/26/20 (!) 163/98      Physical Exam- Limited  Constitutional:  Body mass index is 32.03 kg/m. , not in acute distress, normal state of mind Eyes:  EOMI, no exophthalmos Neck: Supple Cardiovascular: RRR, no murmurs, rubs, or gallops, no edema Respiratory: Adequate breathing efforts, no crackles, rales, rhonchi, or wheezing Musculoskeletal: no gross deformities, strength intact in all four extremities, no gross restriction of joint movements Skin:  no rashes, no hyperemia Neurological: no tremor with outstretched hands    CMP ( most recent) CMP     Component Value Date/Time   NA 136 01/03/2015 1202   K 4.4 06/30/2020 0000   CL 100 01/03/2015 1202   CO2 26 01/03/2015 1202   GLUCOSE 149 (H) 01/03/2015 1202   BUN 12 06/30/2020 0000   CREATININE 0.7 06/30/2020 0000   CREATININE 0.79 01/03/2015 1202   CALCIUM 9.5 01/03/2015 1202   PROT 7.7 01/03/2015 1202   ALBUMIN 3.9 01/03/2015 1202   AST 17 01/03/2015 1202   ALT 19 01/03/2015 1202   ALKPHOS 65  01/03/2015 1202   BILITOT 0.3 01/03/2015 1202   GFRNONAA 103 06/30/2020 0000   GFRNONAA >89 01/03/2015 1202   GFRAA >89 01/03/2015 1202     Diabetic Labs (most recent): Lab Results  Component Value Date   HGBA1C 5.7 12/15/2020   HGBA1C 7.6 06/30/2020   HGBA1C 7.3 (H) 01/03/2015     Lipid Panel ( most recent) Lipid Panel     Component Value Date/Time   CHOL 154 02/19/2013 0910   TRIG 103 02/19/2013 0910   HDL 34 (L) 02/19/2013 0910   CHOLHDL 4.5 02/19/2013 0910   VLDL 21 02/19/2013 0910   LDLCALC 99 02/19/2013 0910      Lab Results  Component Value Date   TSH 0.86 06/30/2020   TSH 1.069 01/03/2015   TSH 0.734 08/14/2012   TSH 0.649 12/10/2007   TSH 1.082 04/30/2007           Assessment & Plan:   1) Type 2 diabetes mellitus without complication, without long-term current use of insulin ([place])  She presents today with her meter and logs showing drastic improvement in her glycemic profile overall.  Her POCT A1c today is 5.7%, improving from last visit of 7.6%.  She has lost approx 26 lbs since last visit and has plans to continue.  She is listening to her body and eating healthier foods and started incorporating more physical exercise.  She denies any hypoglycemia.  - Deborah Sutton has currently uncontrolled symptomatic type 2 DM since 45 years of age.  -Recent labs reviewed.  - I had a long discussion with her about the progressive nature of diabetes and the pathology behind its complications. -her diabetes is not currently complicated but she remains at a high risk for more acute and chronic complications which include CAD, CVA, CKD, retinopathy, and neuropathy. These are all discussed in detail with her.  - Nutritional counseling repeated at each appointment due to patients tendency to fall back in to old habits.  - The patient admits there is a room for improvement in their diet and drink choices. -  Suggestion is made for the patient to avoid simple  carbohydrates from their diet including Cakes, Sweet Desserts / Pastries, Ice Cream, Soda (diet and regular), Sweet Tea, Candies, Chips, Cookies, Sweet Pastries, Store Bought Juices, Alcohol in Excess of 1-2 drinks a day, Artificial Sweeteners, Coffee Creamer, and "Sugar-free" Products. This will help patient to have stable blood glucose  profile and potentially avoid unintended weight gain.   - I encouraged the patient to switch to unprocessed or minimally processed complex starch and increased protein intake (animal or plant source), fruits, and vegetables.   - Patient is advised to stick to a routine mealtimes to eat 3 meals a day and avoid unnecessary snacks (to snack only to correct hypoglycemia).  - she will be scheduled with Jearld Fenton, RDN, CDE for diabetes education.  - I have approached her with the following individualized plan to manage  her diabetes and patient agrees:   -Her diabetes is now controlled.  She is advised to continue Metformin 500 mg po twice daily with meals.  To aid with more weight loss, will increase her Ozempic to 1 mg SQ weekly.   -She can take a break from routine monitoring for now.  - Specific targets for  A1c;  LDL, HDL,  and Triglycerides were discussed with the patient.  2) Blood Pressure /Hypertension:  her blood pressure is not controlled to target, however it is improving greatly.   she is advised to continue her current medications including Lisinopril 40 mg p.o. daily with breakfast.    3) Lipids/Hyperlipidemia:    Her most recent lipid panel shows uncontrolled LDL of 104 and elevated triglycerides of 201.  She is not on any lipid lowering medications and prefers to try lifestyle changes with diet and exercise first.    4)  Weight/Diet:  her Body mass index is 32.03 kg/m.  -  clearly complicating her diabetes care.  She has lost approximately 26 lbs since last visit- a great development for her.  she is a candidate for weight loss. I discussed  with her the fact that loss of 5 - 10% of her  current body weight will have the most impact on her diabetes management.  Exercise, and detailed carbohydrates information provided  -  detailed on discharge instructions.  5) Chronic Care/Health Maintenance: -she is on ACEI/ARB and is encouraged to initiate and continue to follow up with Ophthalmology, Dentist, Podiatrist at least yearly or according to recommendations, and advised to stay away from smoking. I have recommended yearly flu vaccine and pneumonia vaccine at least every 5 years; moderate intensity exercise for up to 150 minutes weekly; and sleep for at least 7 hours a day.  - she is advised to maintain close follow up with Leeanne Rio, MD for primary care needs, as well as her other providers for optimal and coordinated care.     I spent 43 minutes in the care of the patient today including review of labs from [place], Lipids, Thyroid Function, Hematology (current and previous including abstractions from other facilities); face-to-face time discussing  her blood glucose readings/logs, discussing hypoglycemia and hyperglycemia episodes and symptoms, medications doses, her options of short and long term treatment based on the latest standards of care / guidelines;  discussion about incorporating lifestyle medicine;  and documenting the encounter.    Please refer to Patient Instructions for Blood Glucose Monitoring and Insulin/Medications Dosing Guide"  in media tab for additional information. Please  also refer to " Patient Self Inventory" in the Media  tab for reviewed elements of pertinent patient history.  Deborah Sutton participated in the discussions, expressed understanding, and voiced agreement with the above plans.  All questions were answered to her satisfaction. she is encouraged to contact clinic should she have any questions or concerns prior to her return visit.   Follow up plan: - Return  in about 6 months (around  06/17/2021) for Diabetes F/U with A1c in office, Previsit labs.    Rayetta Pigg, [facility] [address] [place], [place] [address] Phone: [phone] Fax: [phone]  12/15/2020, 9:35 AM

## 2021-04-15 ENCOUNTER — Other Ambulatory Visit: Payer: Self-pay | Admitting: Nurse Practitioner

## 2021-05-11 ENCOUNTER — Other Ambulatory Visit: Payer: Self-pay | Admitting: "Endocrinology

## 2021-06-01 ENCOUNTER — Encounter: Payer: Self-pay | Admitting: Nurse Practitioner

## 2021-06-09 ENCOUNTER — Other Ambulatory Visit: Payer: Self-pay | Admitting: "Endocrinology

## 2021-06-21 NOTE — Patient Instructions (Signed)

## 2021-06-22 ENCOUNTER — Encounter: Payer: Self-pay | Admitting: Nurse Practitioner

## 2021-06-22 ENCOUNTER — Ambulatory Visit: Payer: BC Managed Care – PPO | Admitting: Nurse Practitioner

## 2021-06-22 ENCOUNTER — Ambulatory Visit (INDEPENDENT_AMBULATORY_CARE_PROVIDER_SITE_OTHER): Payer: BC Managed Care – PPO | Admitting: Nurse Practitioner

## 2021-06-22 ENCOUNTER — Other Ambulatory Visit: Payer: Self-pay

## 2021-06-22 VITALS — BP 142/94 | HR 76 | Ht 63.0 in | Wt 175.0 lb

## 2021-06-22 DIAGNOSIS — I10 Essential (primary) hypertension: Secondary | ICD-10-CM | POA: Diagnosis not present

## 2021-06-22 DIAGNOSIS — E119 Type 2 diabetes mellitus without complications: Secondary | ICD-10-CM | POA: Diagnosis not present

## 2021-06-22 DIAGNOSIS — E782 Mixed hyperlipidemia: Secondary | ICD-10-CM | POA: Diagnosis not present

## 2021-06-22 LAB — POCT GLYCOSYLATED HEMOGLOBIN (HGB A1C): HbA1c, POC (controlled diabetic range): 5.5 % (ref 0.0–7.0)

## 2021-06-22 MED ORDER — SEMAGLUTIDE (2 MG/DOSE) 8 MG/3ML ~~LOC~~ SOPN: 2.0000 mg | PEN_INJECTOR | SUBCUTANEOUS | 3 refills | Status: DC

## 2021-06-22 NOTE — Progress Notes (Signed)
Endocrinology Follow Up Note       06/22/2021, 8:49 AM   Subjective:    Patient ID: Deborah Sutton, female    DOB: 13-Jan-1976.  Deborah Sutton is being seen in follow up after being seen in consultation for management of currently uncontrolled symptomatic diabetes requested by  Leeanne Rio, MD.   Past Medical History:  Diagnosis Date   Anxiety    COVID-19    Positive test January 2022   Essential hypertension    GERD (gastroesophageal reflux disease)    Hypertension    Polycystic ovarian syndrome    Type 2 diabetes mellitus ([place])     Past Surgical History:  Procedure Laterality Date   CESAREAN SECTION  09/2000   CHOLECYSTECTOMY      Social History   Socioeconomic History   Marital status: Married    Spouse name: Not on file   Number of children: Not on file   Years of education: Not on file   Highest education level: Not on file  Occupational History   Not on file  Tobacco Use   Smoking status: Never   Smokeless tobacco: Never  Vaping Use   Vaping Use: Never used  Substance and Sexual Activity   Alcohol use: No   Drug use: No   Sexual activity: Not on file  Other Topics Concern   Not on file  Social History Narrative   Not on file   Social Determinants of Health   Financial Resource Strain: Not on file  Food Insecurity: Not on file  Transportation Needs: Not on file  Physical Activity: Not on file  Stress: Not on file  Social Connections: Not on file    Family History  Problem Relation Age of Onset   Hypertension Mother    Hyperlipidemia Mother    Hypertension Father    Hyperlipidemia Father    Diabetes Father    Hypertension Sister     Outpatient Encounter Medications as of 06/22/2021  Medication Sig   Cholecalciferol (VITAMIN D3) 25 MCG (1000 UT) CAPS Take 1 capsule by mouth daily in the afternoon.   FEROSUL 325 (65 Fe) MG tablet Take 325 mg by mouth every  morning.   lisinopril (ZESTRIL) 40 MG tablet Take 40 mg by mouth daily.   rOPINIRole (REQUIP) 0.5 MG tablet Take 0.5 mg by mouth at bedtime.   Semaglutide, 2 MG/DOSE, 8 MG/3ML SOPN Inject 2 mg as directed once a week.   [DISCONTINUED] OZEMPIC, 1 MG/DOSE, 4 MG/3ML SOPN INJECT 1MG SUBCUTANEOUSLY ONCE A WEEK   [DISCONTINUED] Accu-Chek Softclix Lancets lancets Use as instructed to test blood glucose four times daily.DX E11.9 (Patient not taking: Reported on 06/22/2021)   [DISCONTINUED] clonazePAM (KLONOPIN) 0.5 MG tablet Take 0.5 mg by mouth at bedtime. (Patient not taking: Reported on 12/15/2020)   [DISCONTINUED] glucose blood (ACCU-CHEK GUIDE) test strip Use as instructed to monitor glucose at least twice daily (Patient not taking: Reported on 06/22/2021)   [DISCONTINUED] ibuprofen (ADVIL) 600 MG tablet Take 600 mg by mouth. (Patient not taking: Reported on 06/22/2021)   [DISCONTINUED] metFORMIN (GLUCOPHAGE) 500 MG tablet Take 500 mg by mouth 2 (two) times daily. (Patient not taking: Reported  on 06/22/2021)   [DISCONTINUED] Multiple Vitamin (MULTIVITAMIN ADULT PO) Take 1 tablet by mouth daily in the afternoon. (Patient not taking: Reported on 06/22/2021)   No facility-administered encounter medications on file as of 06/22/2021.    ALLERGIES: No Known Allergies  VACCINATION STATUS: Immunization History  Administered Date(s) Administered   Hep A / Hep B 03/22/2006   Influenza Whole 03/19/2006   Influenza,inj,Quad PF,6+ Mos 03/04/2013   MMR 03/22/2006   Td 03/22/2006    Diabetes She presents for her follow-up diabetic visit. She has type 2 diabetes mellitus. Onset time: Was diagnosed with Gestational diabetes with all of her children (most recent child born in 2020) and has not been able to regain control of her diabetes. Her disease course has been improving. There are no hypoglycemic associated symptoms. Associated symptoms include weight loss (intentional). Pertinent negatives for diabetes include no  fatigue. There are no hypoglycemic complications. Symptoms are stable. There are no diabetic complications. Pertinent negatives for diabetic complications include no nephropathy. Risk factors for coronary artery disease include diabetes mellitus, hypertension, obesity, stress, sedentary lifestyle and dyslipidemia. Current diabetic treatments: Ozempic only. She is compliant with treatment most of the time. Her weight is decreasing steadily. She is following a generally healthy diet. Meal planning includes avoidance of concentrated sweets. She has not had a previous visit with a dietitian. She rarely participates in exercise. (She presents today with no meter or logs to review.  She was not asked to routinely monitor glucose due to safe therapy with Ozempic only.  Her PCP did take her off Metformin in between visits for symptoms of hypoglycemia.  Her POCT A1c today is 5.5%, improving from last visit of 5.7%.  She denies any significant hypoglycemia.  She says her weight loss has plateaued again and is asking to increase her Ozempic dose.) An ACE inhibitor/angiotensin II receptor blocker is being taken. She does not see a podiatrist.Eye exam is current.  Hypertension This is a chronic problem. The current episode started more than 1 year ago. The problem has been gradually improving since onset. The problem is uncontrolled. There are no associated agents to hypertension. Risk factors for coronary artery disease include diabetes mellitus, sedentary lifestyle, stress, obesity and dyslipidemia. Past treatments include ACE inhibitors. The current treatment provides mild improvement. Compliance problems include diet and exercise.   Hyperlipidemia This is a chronic problem. The current episode started more than 1 year ago. The problem is uncontrolled. Recent lipid tests were reviewed and are high. Exacerbating diseases include diabetes. Factors aggravating her hyperlipidemia include fatty foods. She is currently on no  antihyperlipidemic treatment. Compliance problems include adherence to diet and adherence to exercise.  Risk factors for coronary artery disease include diabetes mellitus, dyslipidemia, family history and hypertension.    Review of systems  Constitutional: + steadily decreasing body weight-has plateaued again,  current Body mass index is 31 kg/m. , no fatigue, no subjective hyperthermia, no subjective hypothermia Eyes: no blurry vision, no xerophthalmia ENT: no sore throat, no nodules palpated in throat, no dysphagia/odynophagia, no hoarseness Cardiovascular: no chest pain, no shortness of breath, no palpitations, no leg swelling Respiratory: no cough, no shortness of breath Gastrointestinal: no nausea/vomiting/diarrhea Musculoskeletal: no muscle/joint aches Skin: no rashes, no hyperemia Neurological: no tremors, no numbness, no tingling, no dizziness Psychiatric: no depression, no anxiety    Objective:     BP (!) 142/94    Pulse 76    Ht _0  (1.6 m)    Wt 175 lb (79.4 kg)  SpO2 100%    BMI 31.00 kg/m   Wt Readings from Last 3 Encounters:  06/22/21 175 lb (79.4 kg)  12/15/20 180 lb 12.8 oz (82 kg)  08/11/20 206 lb (93.4 kg)     BP Readings from Last 3 Encounters:  06/22/21 (!) 142/94  12/15/20 (!) 134/91  08/11/20 (!) 174/106      Physical Exam- Limited  Constitutional:  Body mass index is 31 kg/m. , not in acute distress, normal state of mind Eyes:  EOMI, no exophthalmos Neck: Supple Cardiovascular: RRR, no murmurs, rubs, or gallops, no edema Respiratory: Adequate breathing efforts, no crackles, rales, rhonchi, or wheezing Musculoskeletal: no gross deformities, strength intact in all four extremities, no gross restriction of joint movements Skin:  no rashes, no hyperemia Neurological: no tremor with outstretched hands    CMP ( most recent) CMP     Component Value Date/Time   NA 136 01/03/2015 1202   K 4.4 06/30/2020 0000   CL 100 01/03/2015 1202   CO2 26  01/03/2015 1202   GLUCOSE 149 (H) 01/03/2015 1202   BUN 12 06/30/2020 0000   CREATININE 0.7 06/30/2020 0000   CREATININE 0.79 01/03/2015 1202   CALCIUM 9.5 01/03/2015 1202   PROT 7.7 01/03/2015 1202   ALBUMIN 3.9 01/03/2015 1202   AST 17 01/03/2015 1202   ALT 19 01/03/2015 1202   ALKPHOS 65 01/03/2015 1202   BILITOT 0.3 01/03/2015 1202   GFRNONAA 103 06/30/2020 0000   GFRNONAA >89 01/03/2015 1202   GFRAA >89 01/03/2015 1202     Diabetic Labs (most recent): Lab Results  Component Value Date   HGBA1C 5.5 06/22/2021   HGBA1C 5.7 12/15/2020   HGBA1C 7.6 06/30/2020     Lipid Panel ( most recent) Lipid Panel     Component Value Date/Time   CHOL 154 02/19/2013 0910   TRIG 103 02/19/2013 0910   HDL 34 (L) 02/19/2013 0910   CHOLHDL 4.5 02/19/2013 0910   VLDL 21 02/19/2013 0910   LDLCALC 99 02/19/2013 0910      Lab Results  Component Value Date   TSH 0.86 06/30/2020   TSH 1.069 01/03/2015   TSH 0.734 08/14/2012   TSH 0.649 12/10/2007   TSH 1.082 04/30/2007           Assessment & Plan:   1) Type 2 diabetes mellitus without complication, without long-term current use of insulin ([place])- resolved  She presents today with no meter or logs to review.  She was not asked to routinely monitor glucose due to safe therapy with Ozempic only.  Her PCP did take her off Metformin in between visits for symptoms of hypoglycemia.  Her POCT A1c today is 5.5%, improving from last visit of 5.7%.  She denies any significant hypoglycemia.  She says her weight loss has plateaued again and is asking to increase her Ozempic dose.  - Deborah Sutton has currently uncontrolled symptomatic type 2 DM since 46 years of age.  -Recent labs reviewed.  - I had a long discussion with her about the progressive nature of diabetes and the pathology behind its complications. -her diabetes is not currently complicated but she remains at a high risk for more acute and chronic complications which include  CAD, CVA, CKD, retinopathy, and neuropathy. These are all discussed in detail with her.  - Nutritional counseling repeated at each appointment due to patients tendency to fall back in to old habits.  - The patient admits there is a room for improvement in  their diet and drink choices. -  Suggestion is made for the patient to avoid simple carbohydrates from their diet including Cakes, Sweet Desserts / Pastries, Ice Cream, Soda (diet and regular), Sweet Tea, Candies, Chips, Cookies, Sweet Pastries, Store Bought Juices, Alcohol in Excess of 1-2 drinks a day, Artificial Sweeteners, Coffee Creamer, and "Sugar-free" Products. This will help patient to have stable blood glucose profile and potentially avoid unintended weight gain.   - I encouraged the patient to switch to unprocessed or minimally processed complex starch and increased protein intake (animal or plant source), fruits, and vegetables.   - Patient is advised to stick to a routine mealtimes to eat 3 meals a day and avoid unnecessary snacks (to snack only to correct hypoglycemia).  - she will be scheduled with Jearld Fenton, RDN, CDE for diabetes education.  - I have approached her with the following individualized plan to manage  her diabetes and patient agrees:   -Her diabetes is now in remission.   To aid with more weight loss, will increase her Ozempic to 2 mg SQ weekly.   -She can take a break from routine monitoring for now.  - Specific targets for  A1c;  LDL, HDL,  and Triglycerides were discussed with the patient.  2) Blood Pressure /Hypertension:  her blood pressure is not controlled to target. She has not yet taken her BP medications this morning.   she is advised to continue her current medications including Lisinopril 40 mg p.o. daily with breakfast.    3) Lipids/Hyperlipidemia:    Her most recent lipid panel from 12/05/20 shows uncontrolled LDL of 104 and elevated triglycerides of 201.  She is not on any lipid lowering  medications and prefers to try lifestyle changes with diet and exercise first.  Will recheck lipid panel prior to next visit.  4)  Weight/Diet:  her Body mass index is 31 kg/m.  -  clearly complicating her diabetes care.  She has lost approximately 30+ lbs since last visit- a great development for her.  she is a candidate for weight loss. I discussed with her the fact that loss of 5 - 10% of her  current body weight will have the most impact on her diabetes management.  Exercise, and detailed carbohydrates information provided  -  detailed on discharge instructions.  5) Chronic Care/Health Maintenance: -she is on ACEI/ARB and is encouraged to initiate and continue to follow up with Ophthalmology, Dentist, Podiatrist at least yearly or according to recommendations, and advised to stay away from smoking. I have recommended yearly flu vaccine and pneumonia vaccine at least every 5 years; moderate intensity exercise for up to 150 minutes weekly; and sleep for at least 7 hours a day.  - she is advised to maintain close follow up with Leeanne Rio, MD for primary care needs, as well as her other providers for optimal and coordinated care.      I spent 30 minutes in the care of the patient today including review of labs from [facility], Lipids, Thyroid Function, Hematology (current and previous including abstractions from other facilities); face-to-face time discussing  her blood glucose readings/logs, discussing hypoglycemia and hyperglycemia episodes and symptoms, medications doses, her options of short and long term treatment based on the latest standards of care / guidelines;  discussion about incorporating lifestyle medicine;  and documenting the encounter.    Please refer to Patient Instructions for Blood Glucose Monitoring and Insulin/Medications Dosing Guide"  in media tab for additional information. Please  also refer to " Patient Self Inventory" in the Media  tab for reviewed elements of pertinent  patient history.  Ardith Dark participated in the discussions, expressed understanding, and voiced agreement with the above plans.  All questions were answered to her satisfaction. she is encouraged to contact clinic should she have any questions or concerns prior to her return visit.   Follow up plan: - Return in about 6 months (around 12/20/2021) for Diabetes F/U with A1c in office, Previsit labs, Bring meter and logs.    Rayetta Pigg, [facility] [address] [place], [place] [address] Phone: [phone] Fax: [phone]  06/22/2021, 8:49 AM

## 2021-11-22 ENCOUNTER — Other Ambulatory Visit: Payer: Self-pay | Admitting: Nurse Practitioner

## 2021-11-30 LAB — TSH: TSH: 0.73 (ref 0.41–5.90)

## 2021-12-21 ENCOUNTER — Ambulatory Visit: Payer: BC Managed Care – PPO | Admitting: Nurse Practitioner

## 2022-01-11 ENCOUNTER — Ambulatory Visit: Payer: BC Managed Care – PPO | Admitting: Nurse Practitioner

## 2022-02-08 ENCOUNTER — Other Ambulatory Visit: Payer: Self-pay | Admitting: Nurse Practitioner

## 2022-03-17 ENCOUNTER — Other Ambulatory Visit: Payer: Self-pay | Admitting: Nurse Practitioner

## 2022-03-19 ENCOUNTER — Encounter: Payer: Self-pay | Admitting: Nurse Practitioner

## 2022-03-19 NOTE — Telephone Encounter (Signed)
She will need to schedule follow up prior to having her medication refilled.  I see she has cancelled her last 2 appts with me.  I haven't seen her since February.
